# Patient Record
Sex: Male | Born: 1975 | Race: Black or African American | Hispanic: No | Marital: Married | Smoking: Current every day smoker
Health system: Southern US, Community
[De-identification: ages and names within clinical notes are randomized; demographics above are authoritative.]

## PROBLEM LIST (undated history)

## (undated) DIAGNOSIS — R51 Headache: Secondary | ICD-10-CM

## (undated) DIAGNOSIS — K219 Gastro-esophageal reflux disease without esophagitis: Secondary | ICD-10-CM

## (undated) HISTORY — DX: Gastro-esophageal reflux disease without esophagitis: K21.9

## (undated) HISTORY — DX: Headache: R51

## (undated) HISTORY — PX: APPENDECTOMY: SHX54

---

## 2012-02-02 ENCOUNTER — Emergency Department (HOSPITAL_BASED_OUTPATIENT_CLINIC_OR_DEPARTMENT_OTHER)
Admission: EM | Admit: 2012-02-02 | Discharge: 2012-02-02 | Disposition: A | Discharge status: Home / Self Care | Payer: Self-pay | Attending: Emergency Medicine | Admitting: Emergency Medicine

## 2012-02-02 ENCOUNTER — Encounter (HOSPITAL_BASED_OUTPATIENT_CLINIC_OR_DEPARTMENT_OTHER): Payer: Self-pay | Admitting: Emergency Medicine

## 2012-02-02 ENCOUNTER — Emergency Department (INDEPENDENT_AMBULATORY_CARE_PROVIDER_SITE_OTHER): Payer: Self-pay

## 2012-02-02 DIAGNOSIS — M7918 Myalgia, other site: Secondary | ICD-10-CM

## 2012-02-02 DIAGNOSIS — J45909 Unspecified asthma, uncomplicated: Secondary | ICD-10-CM | POA: Insufficient documentation

## 2012-02-02 DIAGNOSIS — IMO0001 Reserved for inherently not codable concepts without codable children: Secondary | ICD-10-CM | POA: Insufficient documentation

## 2012-02-02 DIAGNOSIS — R079 Chest pain, unspecified: Secondary | ICD-10-CM

## 2012-02-02 DIAGNOSIS — R109 Unspecified abdominal pain: Secondary | ICD-10-CM | POA: Insufficient documentation

## 2012-02-02 LAB — URINALYSIS, ROUTINE W REFLEX MICROSCOPIC
Bilirubin Urine: NEGATIVE
Nitrite: NEGATIVE
Specific Gravity, Urine: 1.023 (ref 1.005–1.030)
Urobilinogen, UA: 1 mg/dL (ref 0.0–1.0)

## 2012-02-02 MED ORDER — OXYCODONE-ACETAMINOPHEN 5-325 MG PO TABS: 1.0000 | ORAL_TABLET | Freq: Four times a day (QID) | ORAL | Status: AC | PRN

## 2012-02-02 MED ORDER — IBUPROFEN 800 MG PO TABS: 800.0000 mg | ORAL_TABLET | Freq: Three times a day (TID) | ORAL | Status: AC

## 2012-02-02 MED ORDER — KETOROLAC TROMETHAMINE 30 MG/ML IJ SOLN
30.0000 mg | Freq: Once | INTRAMUSCULAR | Status: AC
  Administered 2012-02-02: 30 mg via INTRAMUSCULAR
  Filled 2012-02-02: qty 1

## 2012-02-02 MED ORDER — CYCLOBENZAPRINE HCL 10 MG PO TABS: 10.0000 mg | ORAL_TABLET | Freq: Every evening | ORAL | Status: AC | PRN

## 2012-02-02 NOTE — ED Notes (Signed)
Pt with pain in right rib area mid axillary. Onset of pain after push mowing lawn on sat and progressively worsening.

## 2012-02-02 NOTE — Discharge Instructions (Signed)
Pain Medicine Instructions You have been given a prescription for pain medicines. These medicines may affect your ability to think clearly. They may also affect your ability to perform physical activities. Take these medicines only as needed for pain. You do not need to take them if you are not having pain, unless directed by your caregiver. You can take less than the prescribed dose if you find a smaller amount of medicine controls the pain. It may not be possible to make all of your pain go away, but you should be comfortable enough to move, breathe, and take care of yourself. After you start taking pain medicines, while taking the medicines, and for 8 hours after stopping the medicines:  Do not drive.   Do not operate machinery.   Do not operate power tools.   Do not sign legal documents.   Do not supervise children by yourself.   Do not participate in activities that require climbing or being in high places.   Do not enter a body of water (lake, river, ocean, spa, swimming pool) without an adult nearby who can help you.  You may have been prescribed a pain medicine that contains acetaminophen (paracetamol). If so, take only the amount directed by your caregiver. Do not take any other acetaminophen while taking this medicine. An overdose of acetaminophen can result in severe liver damage. If you are taking other medicines, check the active ingredients for acetaminophen. Acetaminophen is found in hundreds of over-the-counter and prescription medicines. These include cold relief products, menstrual cramp relief medicines, fever-reducing medicines, acid indigestion relief products, and pain relief products. HOME CARE INSTRUCTIONS   Do not drink alcohol, take sleeping pills, or take other medicines until at least 8 hours after your last dose of pain medicine, or as directed by your caregiver.   Use a bulk stool softener if you become constipated from your pain medicines. Increasing your intake  of fruits and vegetables will also help.   Write down the times when you take your medicines. Look at the times before taking your next dose of medicine. It is easy to become confused while on pain medicines. Recording the times helps you to avoid an overdose.  SEEK MEDICAL CARE IF:  Your medicine is not helping the pain go away.   You vomit or have diarrhea shortly after taking the medicine.   You develop new pain in areas that did not hurt before.  SEEK IMMEDIATE MEDICAL CARE IF:  You feel dizzy or faint.   You feel there are other problems that might be caused by your medicine.  MAKE SURE YOU:   Understand these instructions.   Will watch your condition.   Will get help right away if you are not doing well or get worse.  Document Released: 01/04/2001 Document Revised: 09/17/2011 Document Reviewed: 09/12/2010 ExitCare Patient Information 2012 [employer]  RESOURCE GUIDE  Dental Problems  Patients with Medicaid: [facility]                     [facility] [address]                                           [address] Phone:  [phone]                                                     Phone:  [phone]  If unable to pay or uninsured, contact:  [facility] or [facility] to become qualified for the adult dental clinic.  Chronic Pain Problems Contact [facility]  [phone] Patients need to be referred by their primary care doctor.  Insufficient Money for Medicine Contact [employer]:  call "211" or [facility] 271-5999.  No Primary Care Doctor Call [facility]  832-8000 Other agencies that provide inexpensive medical care    [facility]  [phone]     Internal Medicine  [phone]    [facility]  [phone]    Women's Clinic  [phone]    [facility]  [phone]    [facility]  [phone]  [facility]  [phone] [facility]  [phone] [facility]   [phone] (emergency services [phone])  Abuse/Neglect [place] Child Abuse Hotline (336) 641-3795 [place] [facility] Hotline 800-378-5315 (After Hours)  Emergency Shelter [facility] (336) 271-5985  [facility] (336) 275-9566 [facility] (704) 372-4663  MRSA Hotline #:   832-7006    [place] [facility]  [facility] [place]  [employer]                           [facility] [address] [place]                     [address]         [address]  [place]                                               [place]                              [place] Phone:  [phone]                                  Phone:  [phone]                   Phone:  [phone]  [facility] Phone:  [phone]  [place] Child Abuse Hotline (336) 342-1394 (336) 342-3537 (After Hours)  

## 2012-02-02 NOTE — ED Provider Notes (Addendum)
History     CSN: [id number]  Arrival date & time 02/02/12  0038   First MD Initiated Contact with Patient 02/02/12 0049      Chief Complaint  Patient presents with  . Flank Pain    (Consider location/radiation/quality/duration/timing/severity/associated sxs/prior treatment) HPI  pw 3 days of Rt flank pain. Constant. Worse with movement and lying down. Worse with deep breathing. No relief with tylenol Pain 8/10 at this time. Denies shortness of breath, chest pain. Min dry cough. Pain began after cutting grass but denies specific injury. Lifts occ at work but denies specific injury. Denies N/V/fever/chills. Denies constipation, diarrhea. Denies hematuria/dysuria/freq/urgency. Denies rash, sick contacts.  Denies history of recent trauma/falls. Denies h/o malignancy, DM, immunocompromise  injection drug use, immunosuppression, indwelling urinary catheter, prolonged steroid use, skin or urinary tract infection. No numbness/tingling/weakness of extremities.  Denies saddle anesthesia, no urinary incontinence or retention. Denies h/o VTE in self or family. No recent hosp/surg/immob. No h/o cancer. Denies exogenous hormone use, no leg pain or swelling.  No h/o nephrolithiasis. H/o chronic lower back pain. This feels different   ED Notes, ED Provider Notes from 02/02/12 0000 to 02/02/12 00:46:05       Kellie Dulcy Fanny, RN 02/02/2012 00:44      Pt with pain in right rib area mid axillary. Onset of pain after push mowing lawn on sat and progressively worsening.     Past Medical History  Diagnosis Date  . Asthma     Past Surgical History  Procedure Date  . Appendectomy     No family history on file.  History  Substance Use Topics  . Smoking status: Current Everyday Smoker  . Smokeless tobacco: Not on file  . Alcohol Use: Yes      Review of Systems  All other systems reviewed and are negative.   except as noted HPI   Allergies  Review of patient's allergies indicates no  known allergies.  Home Medications   Current Outpatient Rx  Name Route Sig Dispense Refill  . ACETAMINOPHEN 650 MG RE SUPP Oral Take 650 mg by mouth as needed.    . CYCLOBENZAPRINE HCL 10 MG PO TABS Oral Take 1 tablet (10 mg total) by mouth at bedtime as needed for muscle spasms. 10 tablet 0  . IBUPROFEN 800 MG PO TABS Oral Take 1 tablet (800 mg total) by mouth 3 (three) times daily. 21 tablet 0  . OXYCODONE-ACETAMINOPHEN 5-325 MG PO TABS Oral Take 1 tablet by mouth every 6 (six) hours as needed for pain. 10 tablet 0    BP 148/88  Pulse 90  Temp(Src) 98.2 F (36.8 C) (Oral)  Resp 18  Ht 5\' 11"  (1.803 m)  Wt 200 lb (90.719 kg)  BMI 27.89 kg/m2  SpO2 99%  Physical Exam  Nursing note and vitals reviewed. Constitutional: He is oriented to person, place, and time. He appears well-developed and well-nourished. No distress.  HENT:  Head: Atraumatic.  Mouth/Throat: Oropharynx is clear and moist.  Eyes: Conjunctivae are normal. Pupils are equal, round, and reactive to light.  Neck: Neck supple.  Cardiovascular: Normal rate, regular rhythm, normal heart sounds and intact distal pulses.  Exam reveals no gallop and no friction rub.   No murmur heard. Pulmonary/Chest: Effort normal. No respiratory distress. He has no wheezes. He has no rales.  Abdominal: Soft. Bowel sounds are normal. There is no tenderness. There is no rebound and no guarding.  Musculoskeletal: Normal range of motion. He exhibits no edema and no  tenderness.  Neurological: He is alert and oriented to person, place, and time.  Skin: Skin is warm and dry.          ttp  Psychiatric: He has a normal mood and affect.    ED Course  Procedures (including critical care time)  Labs Reviewed  URINALYSIS, ROUTINE W REFLEX MICROSCOPIC - Abnormal; Notable for the following:    APPearance CLOUDY (*)    All other components within normal limits   Dg Ribs Unilateral W/chest Right  02/02/2012  *RADIOLOGY REPORT*  Clinical Data:  Anterior and right lower rib pain for 2 days.  No injury.  RIGHT RIBS AND CHEST - 3+ VIEW  Comparison: None.  Findings: Normal heart size and pulmonary vascularity.  No focal airspace consolidation in the lungs.  No blunting of costophrenic angles.  No pneumothorax.  Right ribs appear intact.  No evidence of acute fracture.  No focal bone lesion or bone expansion.  IMPRESSION: No evidence of active pulmonary disease.  No displaced right rib fractures.  Original Report Authenticated By: Marlon Pel, M.D.    1. Musculoskeletal pain     MDM  Musculoskeletal pain without clear precipitating factors. Doubt PE (PERC neg, unlikely by history). No abdominal ttp. Doubt problem with liver. Low susp nephrolithiasis based on clinical exam and history. Rib/CXR without bony lesion, no pna (clinically, low susp). Pain 8-->6/10 with toradol. Driving. Home with flexeril, percocet, ibuprofen. No EMC precluding discharge at this time. Given Precautions for return. PMD f/u.         Forbes Cellar, MD 02/02/12 [id number]  Forbes Cellar, MD 02/02/12 0230

## 2012-02-07 ENCOUNTER — Emergency Department (HOSPITAL_BASED_OUTPATIENT_CLINIC_OR_DEPARTMENT_OTHER)
Admission: EM | Admit: 2012-02-07 | Discharge: 2012-02-07 | Disposition: A | Discharge status: Home / Self Care | Payer: Self-pay | Attending: Emergency Medicine | Admitting: Emergency Medicine

## 2012-02-07 ENCOUNTER — Encounter (HOSPITAL_BASED_OUTPATIENT_CLINIC_OR_DEPARTMENT_OTHER): Payer: Self-pay | Admitting: *Deleted

## 2012-02-07 DIAGNOSIS — F17200 Nicotine dependence, unspecified, uncomplicated: Secondary | ICD-10-CM | POA: Insufficient documentation

## 2012-02-07 DIAGNOSIS — J45909 Unspecified asthma, uncomplicated: Secondary | ICD-10-CM | POA: Insufficient documentation

## 2012-02-07 DIAGNOSIS — B029 Zoster without complications: Secondary | ICD-10-CM | POA: Insufficient documentation

## 2012-02-07 MED ORDER — OXYCODONE-ACETAMINOPHEN 5-325 MG PO TABS: 1.0000 | ORAL_TABLET | Freq: Four times a day (QID) | ORAL | Status: AC | PRN

## 2012-02-07 MED ORDER — VALACYCLOVIR HCL 1 G PO TABS: 1000.0000 mg | ORAL_TABLET | Freq: Three times a day (TID) | ORAL | Status: AC

## 2012-02-07 NOTE — ED Provider Notes (Signed)
History     CSN: [id number]  Arrival date & time 02/07/12  0130   First MD Initiated Contact with Patient 02/07/12 0142      Chief Complaint  Patient presents with  . Herpes Zoster    (Consider location/radiation/quality/duration/timing/severity/associated sxs/prior treatment) Patient is a 36 y.o. male presenting with rash.  Rash  This is a new problem. The current episode started 2 days ago. The problem has not changed since onset.The problem is associated with nothing. There has been no fever. The rash is present on the back and torso. The pain is at a severity of 10/10. The pain is severe. The pain has been constant since onset. Associated symptoms include blisters and pain. He has tried nothing for the symptoms. The treatment provided no relief. Risk factors: None.  Seen for back and axillary pain several days ago.  Now with burning vesicular eruption on R back along a dermatome to the abdomen.    Past Medical History  Diagnosis Date  . Asthma     Past Surgical History  Procedure Date  . Appendectomy     History reviewed. No pertinent family history.  History  Substance Use Topics  . Smoking status: Current Everyday Smoker  . Smokeless tobacco: Not on file  . Alcohol Use: Yes      Review of Systems  Skin: Positive for rash.  All other systems reviewed and are negative.    Allergies  Review of patient's allergies indicates no known allergies.  Home Medications   Current Outpatient Rx  Name Route Sig Dispense Refill  . ACETAMINOPHEN 650 MG RE SUPP Oral Take 650 mg by mouth as needed.    . CYCLOBENZAPRINE HCL 10 MG PO TABS Oral Take 1 tablet (10 mg total) by mouth at bedtime as needed for muscle spasms. 10 tablet 0  . IBUPROFEN 800 MG PO TABS Oral Take 1 tablet (800 mg total) by mouth 3 (three) times daily. 21 tablet 0  . OXYCODONE-ACETAMINOPHEN 5-325 MG PO TABS Oral Take 1 tablet by mouth every 6 (six) hours as needed for pain. 10 tablet 0  .  OXYCODONE-ACETAMINOPHEN 5-325 MG PO TABS Oral Take 1 tablet by mouth every 6 (six) hours as needed for pain. 13 tablet 0  . VALACYCLOVIR HCL 1 G PO TABS Oral Take 1 tablet (1,000 mg total) by mouth 3 (three) times daily. 21 tablet 0    BP 128/57  Pulse 88  Temp(Src) 97.9 F (36.6 C) (Oral)  Resp 18  SpO2 97%  Physical Exam  Constitutional: He is oriented to person, place, and time. He appears well-developed and well-nourished. No distress.  HENT:  Mouth/Throat: Oropharynx is clear and moist. No oropharyngeal exudate.       No facial lesions nor oral lesions  Eyes: Conjunctivae are normal. Pupils are equal, round, and reactive to light.  Neck: Normal range of motion. Neck supple.  Cardiovascular: Normal rate and regular rhythm.   Pulmonary/Chest: Effort normal and breath sounds normal.  Abdominal: Soft. Bowel sounds are normal. There is no rebound and no guarding.  Musculoskeletal: Normal range of motion. He exhibits no edema.  Neurological: He is alert and oriented to person, place, and time.  Skin: Skin is warm and dry. Rash noted.     Psychiatric: He has a normal mood and affect.    ED Course  Procedures (including critical care time)  Labs Reviewed - No data to display No results found.   1. Herpes zoster  MDM  Follow up for recheck with your family doctor in 5 days take all medications.  Return for fevers stiff neck altered sensorium or any concerning symptoms        Juan Cielo K Marche Hottenstein-Rasch, MD 02/07/12 4098

## 2012-02-07 NOTE — Discharge Instructions (Signed)

## 2012-02-07 NOTE — ED Notes (Signed)
Pt with rash to abd and back painful and blistered

## 2016-11-05 ENCOUNTER — Emergency Department (HOSPITAL_BASED_OUTPATIENT_CLINIC_OR_DEPARTMENT_OTHER)
Admission: EM | Admit: 2016-11-05 | Discharge: 2016-11-06 | Disposition: A | Discharge status: Home / Self Care | Payer: No Typology Code available for payment source | Attending: Emergency Medicine | Admitting: Emergency Medicine

## 2016-11-05 ENCOUNTER — Encounter (HOSPITAL_BASED_OUTPATIENT_CLINIC_OR_DEPARTMENT_OTHER): Payer: Self-pay | Admitting: *Deleted

## 2016-11-05 DIAGNOSIS — J019 Acute sinusitis, unspecified: Secondary | ICD-10-CM | POA: Insufficient documentation

## 2016-11-05 DIAGNOSIS — Y939 Activity, unspecified: Secondary | ICD-10-CM | POA: Diagnosis not present

## 2016-11-05 DIAGNOSIS — S3992XA Unspecified injury of lower back, initial encounter: Secondary | ICD-10-CM | POA: Diagnosis present

## 2016-11-05 DIAGNOSIS — Y999 Unspecified external cause status: Secondary | ICD-10-CM | POA: Diagnosis not present

## 2016-11-05 DIAGNOSIS — S300XXA Contusion of lower back and pelvis, initial encounter: Secondary | ICD-10-CM

## 2016-11-05 DIAGNOSIS — W182XXA Fall in (into) shower or empty bathtub, initial encounter: Secondary | ICD-10-CM | POA: Insufficient documentation

## 2016-11-05 DIAGNOSIS — J45909 Unspecified asthma, uncomplicated: Secondary | ICD-10-CM | POA: Insufficient documentation

## 2016-11-05 DIAGNOSIS — F17200 Nicotine dependence, unspecified, uncomplicated: Secondary | ICD-10-CM | POA: Diagnosis not present

## 2016-11-05 DIAGNOSIS — Y929 Unspecified place or not applicable: Secondary | ICD-10-CM | POA: Insufficient documentation

## 2016-11-05 MED ORDER — IBUPROFEN 800 MG PO TABS
800.0000 mg | ORAL_TABLET | Freq: Once | ORAL | Status: AC
  Administered 2016-11-05: 800 mg via ORAL
  Filled 2016-11-05: qty 1

## 2016-11-05 NOTE — ED Triage Notes (Signed)
Cough and cold symptoms. States he wants to find out if he has the flu. He also fell in the tub last night and hurt his back. Pain in his lower back.

## 2016-11-06 ENCOUNTER — Emergency Department (HOSPITAL_BASED_OUTPATIENT_CLINIC_OR_DEPARTMENT_OTHER): Payer: No Typology Code available for payment source

## 2016-11-06 MED ORDER — AMOXICILLIN 500 MG PO CAPS: 500.0000 mg | ORAL_CAPSULE | Freq: Three times a day (TID) | ORAL | 0 refills | Status: DC

## 2016-11-06 MED ORDER — METHOCARBAMOL 500 MG PO TABS: 500.0000 mg | ORAL_TABLET | Freq: Two times a day (BID) | ORAL | 0 refills | Status: DC

## 2016-11-06 MED ORDER — HYDROCODONE-ACETAMINOPHEN 5-325 MG PO TABS: 2.0000 | ORAL_TABLET | ORAL | 0 refills | Status: DC | PRN

## 2016-11-06 NOTE — Discharge Instructions (Signed)
See your Physician for recheck.  Return if any problems.  °

## 2016-11-06 NOTE — ED Provider Notes (Signed)
[facility]   CSN: [id number] Arrival date & time: 11/05/16  2221     History   Chief Complaint Chief Complaint  Patient presents with  . Fall  . Cough    HPI Juan Howard is a 41 y.o. male.  The history is provided by the patient. No language interpreter was used.  Fall  This is a new problem. The problem occurs constantly. Pertinent negatives include no headaches. Nothing aggravates the symptoms. Nothing relieves the symptoms. He has tried nothing for the symptoms. The treatment provided no relief.  Cough  Pertinent negatives include no headaches.  Pt complains of congestion and cough for the past 2 weeks. Pt reports he fell and hit low back.  Pt reports he has had some previous low back pain   Past Medical History:  Diagnosis Date  . Asthma     There are no active problems to display for this patient.   Past Surgical History:  Procedure Laterality Date  . APPENDECTOMY         Home Medications    Prior to Admission medications   Medication Sig Start Date End Date Taking? Authorizing Provider  acetaminophen (TYLENOL) 650 MG suppository Take 650 mg by mouth as needed.    Historical Provider, MD  amoxicillin (AMOXIL) 500 MG capsule Take 1 capsule (500 mg total) by mouth 3 (three) times daily. 11/06/16   Elson Areas, PA-C  HYDROcodone-acetaminophen (NORCO/VICODIN) 5-325 MG tablet Take 2 tablets by mouth every 4 (four) hours as needed. 11/06/16   Elson Areas, PA-C  methocarbamol (ROBAXIN) 500 MG tablet Take 1 tablet (500 mg total) by mouth 2 (two) times daily. 11/06/16   Elson Areas, PA-C    Family History No family history on file.  Social History Social History  Substance Use Topics  . Smoking status: Current Every Day Smoker  . Smokeless tobacco: Never Used  . Alcohol use Yes     Allergies   Patient has no known allergies.   Review of Systems Review of Systems  Respiratory: Positive for cough.   Neurological:  Negative for headaches.  All other systems reviewed and are negative.    Physical Exam Updated Vital Signs BP 140/85   Pulse 84   Temp 98 F (36.7 C) (Oral)   Resp 20   Ht 5\' 11"  (1.803 m)   Wt 90.7 kg   SpO2 99%   BMI 27.89 kg/m   Physical Exam  Constitutional: He appears well-developed and well-nourished.  HENT:  Head: Normocephalic and atraumatic.  Right Ear: External ear normal.  Left Ear: External ear normal.  Eyes: Conjunctivae are normal.  Neck: Neck supple.  Cardiovascular: Normal rate and regular rhythm.   No murmur heard. Pulmonary/Chest: Effort normal and breath sounds normal. No respiratory distress.  Abdominal: Soft. There is no tenderness.  Musculoskeletal: Normal range of motion. He exhibits no edema.  Diffusely tender low spine   Neurological: He is alert.  Skin: Skin is warm and dry.  Psychiatric: He has a normal mood and affect.  Nursing note and vitals reviewed.    ED Treatments / Results  Labs (all labs ordered are listed, but only abnormal results are displayed) Labs Reviewed - No data to display  EKG  EKG Interpretation None       Radiology Dg Lumbar Spine Complete  Result Date: 11/06/2016 CLINICAL DATA:  Chronic lower back pain. Fell in bathtub. Left leg pain. Initial encounter. EXAM: LUMBAR SPINE - COMPLETE  4+ VIEW COMPARISON:  None. FINDINGS: There is no evidence of fracture or subluxation. Vertebral bodies demonstrate normal height and alignment. Intervertebral disc spaces are preserved. The visualized neural foramina are grossly unremarkable in appearance. The visualized bowel gas pattern is unremarkable in appearance; air and stool are noted within the colon. The sacroiliac joints are within normal limits. IMPRESSION: No evidence of fracture or subluxation along the lumbar spine. Electronically Signed   By: Roanna RaiderJeffery  Chang M.D.   On: 11/06/2016 00:58    Procedures Procedures (including critical care time)  Medications Ordered in  ED Medications  ibuprofen (ADVIL,MOTRIN) tablet 800 mg (800 mg Oral Given 11/05/16 2236)     Initial Impression / Assessment and Plan / ED Course  I have reviewed the triage vital signs and the nursing notes.  Pertinent labs & imaging results that were available during my care of the patient were reviewed by me and considered in my medical decision making (see chart for details).       Final Clinical Impressions(s) / ED Diagnoses   Final diagnoses:  Acute sinusitis, recurrence not specified, unspecified location  Contusion of lower back, initial encounter    New Prescriptions New Prescriptions   AMOXICILLIN (AMOXIL) 500 MG CAPSULE    Take 1 capsule (500 mg total) by mouth 3 (three) times daily.   HYDROCODONE-ACETAMINOPHEN (NORCO/VICODIN) 5-325 MG TABLET    Take 2 tablets by mouth every 4 (four) hours as needed.   METHOCARBAMOL (ROBAXIN) 500 MG TABLET    Take 1 tablet (500 mg total) by mouth 2 (two) times daily.     Lonia SkinnerLeslie K New BostonSofia, PA-C 11/06/16 0122    Paula LibraJohn Molpus, MD 11/06/16 [id number]    Paula LibraJohn Molpus, MD 11/06/16 [phone]

## 2017-06-30 ENCOUNTER — Encounter: Payer: Self-pay | Admitting: Medical

## 2017-06-30 ENCOUNTER — Ambulatory Visit (INDEPENDENT_AMBULATORY_CARE_PROVIDER_SITE_OTHER): Payer: No Typology Code available for payment source | Admitting: Medical

## 2017-06-30 ENCOUNTER — Ambulatory Visit (HOSPITAL_BASED_OUTPATIENT_CLINIC_OR_DEPARTMENT_OTHER)
Admission: RE | Admit: 2017-06-30 | Discharge: 2017-06-30 | Disposition: A | Discharge status: Home / Self Care | Payer: No Typology Code available for payment source | Source: Ambulatory Visit | Attending: Medical | Admitting: Medical

## 2017-06-30 ENCOUNTER — Telehealth: Payer: Self-pay | Admitting: Medical

## 2017-06-30 VITALS — BP 147/72 | HR 74 | Temp 98.0°F | Resp 16 | Ht 71.0 in | Wt 190.8 lb

## 2017-06-30 DIAGNOSIS — R51 Headache: Secondary | ICD-10-CM

## 2017-06-30 DIAGNOSIS — F439 Reaction to severe stress, unspecified: Secondary | ICD-10-CM

## 2017-06-30 DIAGNOSIS — F419 Anxiety disorder, unspecified: Secondary | ICD-10-CM | POA: Diagnosis not present

## 2017-06-30 DIAGNOSIS — F329 Major depressive disorder, single episode, unspecified: Secondary | ICD-10-CM | POA: Diagnosis not present

## 2017-06-30 DIAGNOSIS — R519 Headache, unspecified: Secondary | ICD-10-CM

## 2017-06-30 DIAGNOSIS — G4452 New daily persistent headache (NDPH): Secondary | ICD-10-CM

## 2017-06-30 DIAGNOSIS — R030 Elevated blood-pressure reading, without diagnosis of hypertension: Secondary | ICD-10-CM | POA: Diagnosis not present

## 2017-06-30 DIAGNOSIS — F32A Depression, unspecified: Secondary | ICD-10-CM

## 2017-06-30 MED ORDER — SERTRALINE HCL 50 MG PO TABS: 50.0000 mg | ORAL_TABLET | Freq: Every day | ORAL | 0 refills | Status: DC

## 2017-06-30 MED ORDER — BUTALBITAL-ASA-CAFF-CODEINE 50-325-40-30 MG PO CAPS: 1.0000 | ORAL_CAPSULE | Freq: Four times a day (QID) | ORAL | 0 refills | Status: DC | PRN

## 2017-06-30 MED ORDER — CLONAZEPAM 0.5 MG PO TABS: 0.5000 mg | ORAL_TABLET | Freq: Two times a day (BID) | ORAL | 0 refills | Status: DC | PRN

## 2017-06-30 NOTE — Telephone Encounter (Signed)
Open not chart to review before call patient.

## 2017-06-30 NOTE — Patient Instructions (Addendum)
HA may be associated with high blood pressures. Your bp today when I checked was 135/88 today then 140/90. Please have you wife check bp daily(twice a day) and give [place] update on bp reading in one week.(no nsaids while trying to determine bp levels)  For decreased mood and anxiety, rx sertraline. If you get extreme anxiety making few low dose clonazepam.   For low level ha now can use tylenol. Later in the day if you have severe ha then use butalbital. Don't use clonazepam today since we anticipate you will use butalbital later today.(use first tab at home due to possible side effects).   CT of head order placed. Will order this. Consider if negative and no response with above then consider mri or refer to neurologist.  If any severe ha with neurologic or motor deficits then ED evaluation.  Follow up in 7-10 days or as needed

## 2017-06-30 NOTE — Progress Notes (Signed)
Subjective:    Patient ID: Juan Howard, male    DOB: 10-16-1975, 41 y.o.   MRN: [id number]  HPI   Pt in for first time.  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 Alcohol moderate amount(12 pack a week)  Pt states over past month and half had mild HA. But over past 2 weeks HA is getting worse.  1st 3-4 weeks ha were low level and transient. But constant ha over pas 2t weeks. Pt states wife was talking loudly to daughter and when he woke up he immediately noted severe ha. Gaylyn Rong recently can be so severe that will make him cry. Pt does describe light and sound sensitivity. In past very rare ha. Pt has upcoming yearly check up for optometrist in December. No blurred vision today. But one of his ha was intense in past and vision was mild blurred. When wife checks his bp most of time bp is good. Currently his ha is only 1/10.  Pt has some financial stress due to decreased work. He is a Curator. He is angry and short tempered recently. His wife states he stresses too much. Very tense at work.   Pt when he watches football games drinks beers. Moderate amount when he watches football.   Review of Systems  Constitutional: Negative for chills, fatigue and fever.  HENT: Negative for congestion, ear discharge, ear pain, nosebleeds, postnasal drip and rhinorrhea.   Eyes: Positive for photophobia and visual disturbance. Negative for pain, discharge and itching.       Only one headache to the have some blurred vision. Not presently and that was about a week ago. Other headaches no blurred vision reported.  Respiratory: Negative for cough, chest tightness, shortness of breath and wheezing.   Cardiovascular: Negative for chest pain and palpitations.  Gastrointestinal: Negative for abdominal pain, anal bleeding, constipation, diarrhea and nausea.  Genitourinary: Negative for dysuria and flank pain.  Musculoskeletal: Negative for back  pain, gait problem, joint swelling and neck pain.  Skin: Negative for rash.  Neurological: Positive for headaches. Negative for dizziness, syncope, speech difficulty, weakness and numbness.       States at times ha wakes him form sleep.  Hematological: Negative for adenopathy. Does not bruise/bleed easily.  Psychiatric/Behavioral: Positive for dysphoric mood. Negative for behavioral problems, decreased concentration, hallucinations, sleep disturbance and suicidal ideas. The patient is nervous/anxious.     Past Medical History:  Diagnosis Date  . Asthma      Social History   Social History  . Marital status: Married    Spouse name: N/A  . Number of children: N/A  . Years of education: N/A   Occupational History  . Not on file.   Social History Main Topics  . Smoking status: Current Every Day Smoker  . Smokeless tobacco: Never Used  . Alcohol use Yes  . Drug use: Yes    Types: Marijuana  . Sexual activity: Not on file   Other Topics Concern  . Not on file   Social History Narrative  . No narrative on file    Past Surgical History:  Procedure Laterality Date  . APPENDECTOMY      History reviewed. No pertinent family history.  No Known Allergies  No current outpatient prescriptions on file prior to visit.   No current facility-administered medications on file prior to visit.     BP (!) 147/72   Pulse 74  Temp 98 F (36.7 C) (Oral)   Resp 16   Ht  (1.803 m)   Wt 190 lb 12.8 oz (86.5 kg)   SpO2 100%   BMI 26.61 kg/m       Objective:   Physical Exam  General Mental Status- Alert. General Appearance- Not in acute distress.   Skin General: Color- Normal Color. Moisture- Normal Moisture.  Neck Carotid Arteries- Normal color. Moisture- Normal Moisture. No carotid bruits. No JVD. Some mild right trapezius tenderness to palpation.  Chest and Lung Exam Auscultation: Breath Sounds:-Normal.  Cardiovascular Auscultation:Rythm- Regular. Murmurs  & Other Heart Sounds:Auscultation of the heart reveals- No Murmurs.  Abdomen Inspection:-Inspeection Normal. Palpation/Percussion:Note:No mass. Palpation and Percussion of the abdomen reveal- Non Tender, Non Distended + BS, no rebound or guarding.   Neurologic Cranial Nerve exam:- CN III-XII intact(No nystagmus), symmetric smile. Drift Test:- No drift. Finger to Nose:- Normal/Intact Strength:- 5/5 equal and symmetric strength both upper and lower extremities.     Assessment & Plan:  HA may be associated with high blood pressures. Your bp today when I checked was 135/88  today then 140/90. Please have you wife check bp daily(twice a day) and give [place] update on bp reading in one week.  For decreased mood and anxiety, rx sertraline. If you get extreme anxiety making few low dose clonazepam.   For low level ha now can use tylenol. Later in the day if you have severe ha then use butalbital. Don't use clonazepam today since we anticipate you will use butalbital later today.(use first tab at home due to possible side effects).   CT of head order placed. Will order this. Consider if negative and no response with above then consider mri or refer to neurologist.  If any severe ha with neurologic or motor deficits then ED evaluation.  Follow up in 7-10 days or as needed  Amelio Brosky, Ramon Dredge, [employer]

## 2017-07-06 ENCOUNTER — Telehealth: Payer: Self-pay | Admitting: Medical

## 2017-07-06 NOTE — Telephone Encounter (Signed)
Caller name: Lavel Relation to pt: self  Call back number: [phone] Pharmacy:  Reason for call: Pt is wanting to know if he can get a refill for clonazePAM (KLONOPIN) 0.5 MG tablet, pt states was only given 6 pills and that the med was really helping him to sleep, (last night 07-05-2017, pt was not able to sleep since he was out of meds and the headaches was waking him up). Pt states has an appt on Thursday 07-08-2017 if possible to get the meds before his appt. Please advise.

## 2017-07-07 NOTE — Telephone Encounter (Signed)
I saw patient request for clonazepam after hours. He has appointment for tomorrow. So decided not to prescribe him medication tonight. Need to discuss with him different options for anxiety. If he continues to use clonazepam on a regular basis then want him to sign contract and give UDS. We'll let him know tomorrow.  If he does not keep his appointment tomorrow please call him see how he is doing and we can give him a limited number until he comes in.

## 2017-07-08 ENCOUNTER — Ambulatory Visit (INDEPENDENT_AMBULATORY_CARE_PROVIDER_SITE_OTHER): Payer: No Typology Code available for payment source | Admitting: Medical

## 2017-07-08 ENCOUNTER — Telehealth: Payer: Self-pay | Admitting: Medical

## 2017-07-08 ENCOUNTER — Encounter: Payer: Self-pay | Admitting: Medical

## 2017-07-08 VITALS — BP 130/70 | HR 101 | Temp 98.1°F | Resp 16 | Ht 71.0 in | Wt 186.8 lb

## 2017-07-08 DIAGNOSIS — G4459 Other complicated headache syndrome: Secondary | ICD-10-CM

## 2017-07-08 DIAGNOSIS — R519 Headache, unspecified: Secondary | ICD-10-CM

## 2017-07-08 DIAGNOSIS — G4452 New daily persistent headache (NDPH): Secondary | ICD-10-CM | POA: Diagnosis not present

## 2017-07-08 DIAGNOSIS — F439 Reaction to severe stress, unspecified: Secondary | ICD-10-CM

## 2017-07-08 DIAGNOSIS — Z79899 Other long term (current) drug therapy: Secondary | ICD-10-CM | POA: Diagnosis not present

## 2017-07-08 DIAGNOSIS — R51 Headache: Secondary | ICD-10-CM | POA: Diagnosis not present

## 2017-07-08 DIAGNOSIS — F419 Anxiety disorder, unspecified: Secondary | ICD-10-CM

## 2017-07-08 MED ORDER — CLONAZEPAM 0.5 MG PO TABS: 0.5000 mg | ORAL_TABLET | Freq: Every day | ORAL | 1 refills | Status: AC

## 2017-07-08 MED ORDER — DICLOFENAC SODIUM 75 MG PO TBEC: DELAYED_RELEASE_TABLET | ORAL | 0 refills | Status: DC

## 2017-07-08 MED ORDER — CYCLOBENZAPRINE HCL 10 MG PO TABS: ORAL_TABLET | ORAL | 0 refills | Status: DC

## 2017-07-08 MED ORDER — SERTRALINE HCL 100 MG PO TABS: 100.0000 mg | ORAL_TABLET | Freq: Every day | ORAL | 3 refills | Status: DC

## 2017-07-08 NOTE — Telephone Encounter (Signed)
Would you look at patient's neurology referral and MRI order. [facility] is [facility] available after 2 PM? Please let me know tomorrow 07/09/2017

## 2017-07-08 NOTE — Patient Instructions (Addendum)
For your recent severe headaches, I have put in referral for you to see a neurologist. I am trying to get this done as quickly as possible. I have asked referral staff to call around to  see who  can see you the fastest. I have decided to go ahead and try to get MRI of the head scheduled.  For your persisting anxiety and stress, I am increasing your sertraline 100 mg a day and refilling your prescription of clonazepam. I do want you to sign control medication contract and give UDS today.  Tonight I want you to try prescription of diclofenac and Flexeril muscle relaxant for the headache.(I would ask that you hold the clonazepam tonight when you use the Flexeril.) I want to know if this combination stops your headache. Please give me update. Pending referral to neurologist, I still advise you to be seen in the emergency department for severe headache or motor/neuro symptoms as we discussed. If you need to be seen in the emergency department recommend [facility] Main ED as they do have MRI capability.  Also would ask that you wife continue to check your blood pressure and make sure your blood pressure is not hovering around 140/90(or above that level.)  Follow-up in 7-10 days or as needed.

## 2017-07-08 NOTE — Progress Notes (Signed)
Subjective:    Patient ID: Juan Howard, male    DOB: 1976/01/23, 41 y.o.   MRN: [id number]  HPI   Pt in for follow up.  Pt states he is still stress about work. But he states it is not extreme stress. But still financial concerns. Pt did use klonopin daily about 4 pm in afternoon and would decrease ha and for 3 nights in a row and he slept well. He does request to be on this as it has helped him overall deal with the stress. He also thinks this sertraline has helped with stress/anxiety. He has no side effects and his wife wants him to be on a higher dose. He is okay with using high-dose as well.  Pt used fiorinal and it did not help his ha at all. Pt states he has consistent throbbing ha. No gross motor or sensory function. Now has ha for  been present for past 3-4 weeks. Pain/headache varies in intensity. Presently low level ha. Some ha that wakes him at night. He is staying hydrated during the day. Pt CT of head was negative.(fiorcet did not help with ha. Excedrin migrain did not help)  Pt wife did check his blood pressures. He states she says bp were good. But he did not remember. His wife who is a nurse checked and she states was 130/70. She checked 3 times and each time said was good.   Presently pt has level 1/10 headache.   Pt states 3-4 times a week will wake up from sleep with ha.    Review of Systems  Constitutional: Negative for chills, fatigue and fever.  HENT: Negative for congestion, sinus pain and sinus pressure.   Respiratory: Negative for cough, chest tightness, shortness of breath and wheezing.   Cardiovascular: Negative for chest pain and palpitations.  Gastrointestinal: Negative for abdominal pain and blood in stool.  Musculoskeletal: Negative for back pain, neck pain and neck stiffness.  Skin: Negative for rash.  Neurological: Positive for headaches. Negative for dizziness, syncope, speech difficulty, weakness, light-headedness and numbness.  Hematological:  Negative for adenopathy. Does not bruise/bleed easily.  Psychiatric/Behavioral: Positive for sleep disturbance. Negative for agitation, behavioral problems, confusion, self-injury and suicidal ideas. The patient is nervous/anxious.    Past Medical History:  Diagnosis Date  . Asthma    in past. Rare use about 6 times a year uses inhaler.  Marland Kitchen GERD (gastroesophageal reflux disease)      Social History   Social History  . Marital status: Married    Spouse name: N/A  . Number of children: N/A  . Years of education: N/A   Occupational History  . Not on file.   Social History Main Topics  . Smoking status: Current Every Day Smoker    Types: Cigars  . Smokeless tobacco: Never Used  . Alcohol use Yes     Comment: 12 pack a week.  . Drug use: Yes    Types: Marijuana  . Sexual activity: Yes   Other Topics Concern  . Not on file   Social History Narrative  . No narrative on file    Past Surgical History:  Procedure Laterality Date  . APPENDECTOMY      No family history on file.  No Known Allergies  No current outpatient prescriptions on file prior to visit.   No current facility-administered medications on file prior to visit.     BP 130/70 Comment: checked twice today. espac  Pulse (!) 101   Temp  98.1 F (36.7 C) (Oral)   Resp 16   Ht  (1.803 m)   Wt 186 lb 12.8 oz (84.7 kg)   SpO2 97%   BMI 26.05 kg/m       Objective:   Physical Exam   General  Mental Status - Alert. General Appearance - Well groomed. Not in acute distress.  Skin Rashes- No Rashes.  HEENT Head- Normal. Ear Auditory Canal - Left- Normal. Right - Normal.Tympanic Membrane- Left- Normal. Right- Normal. Eye Sclera/Conjunctiva- Left- Normal. Right- Normal. Nose & Sinuses Nasal Mucosa- Left- Not Boggy and Congested. Right-not  Boggy and  Congested.Bilateral no maxillary and no frontal sinus pressure. Mouth & Throat Lips: Upper Lip- Normal: no dryness, cracking, pallor, cyanosis,  or vesicular eruption. Lower Lip-Normal: no dryness, cracking, pallor, cyanosis or vesicular eruption. Buccal Mucosa- Bilateral- No Aphthous ulcers. Oropharynx- No Discharge or Erythema. Tonsils: Characteristics- Bilateral- No Erythema or Congestion. Size/Enlargement- Bilateral- No enlargement. Discharge- bilateral-None.  Neck Neck- Supple. No Masses.   Chest and Lung Exam Auscultation: Breath Sounds:-Clear even and unlabored.  Cardiovascular Auscultation:Rythm- Regular, rate and rhythm. Murmurs & Other Heart Sounds:Ausculatation of the heart reveal- No Murmurs.  Lymphatic Head & Neck General Head & Neck Lymphatics: Bilateral: Description- No Localized lymphadenopathy.  General Mental Status- Alert. General Appearance- Not in acute distress.   Skin General: Color- Normal Color. Moisture- Normal Moisture.  Neck Carotid Arteries- Normal color. Moisture- Normal Moisture. No carotid bruits. No JVD.  Chest and Lung Exam Auscultation: Breath Sounds:-Normal.  Cardiovascular Auscultation:Rythm- Regular. Murmurs & Other Heart Sounds:Auscultation of the heart reveals- No Murmurs.  Abdomen Inspection:-Inspeection Normal. Palpation/Percussion:Note:No mass. Palpation and Percussion of the abdomen reveal- Non Tender, Non Distended + BS, no rebound or guarding.    Neurologic Cranial Nerve exam:- CN III-XII intact(No nystagmus), symmetric smile. Drift Test:- No drift. Finger to Nose:- Normal/Intact Strength:- 5/5 equal and symmetric strength both upper and lower extremities.      Assessment & Plan:  For your recent severe headaches, I have put in referral for you to see a neurologist. I am trying to get this done as quickly as possible. I have asked referral staff to call around see you can see you the fastest. I have decided to go ahead and try to get MRI of the head scheduled.  For your recent severe headaches, I have put in referral for you to see a neurologist. I am trying  to get this done as quickly as possible. I have asked referral staff to call around to  see who  can see you the fastest. I have decided to go ahead and try to get MRI of the head scheduled.  For your persisting anxiety and stress, I am increasing your sertraline 100 mg a day and refilling your prescription of clonazepam. I do want you to sign control medication contract and give UDS today.  Tonight I want you to try prescription of diclofenac and Flexeril muscle relaxant for the headache.(I would ask that you hold the clonazepam tonight when you use the Flexeril.) I want to know if this combination stops your headache. Please give me update. Pending referral to neurologist, I still advise you to be seen in the emergency department for severe headache or motor/neuro symptoms as we discussed. If you need to be seen in the emergency department recommend [facility] Main ED as they do have MRI capability.  Also would ask that you wife continue to check your blood pressure and make sure your blood pressure is  not hovering around 140/90(or above that level.)  Follow-up in 7-10 days or as needed.  Kollyns Mickelson, Ramon Dredge, PA-C

## 2017-07-10 ENCOUNTER — Ambulatory Visit (HOSPITAL_BASED_OUTPATIENT_CLINIC_OR_DEPARTMENT_OTHER)
Admission: RE | Admit: 2017-07-10 | Discharge: 2017-07-10 | Disposition: A | Discharge status: Home / Self Care | Payer: No Typology Code available for payment source | Source: Ambulatory Visit | Attending: Medical | Admitting: Medical

## 2017-07-10 DIAGNOSIS — G4452 New daily persistent headache (NDPH): Secondary | ICD-10-CM | POA: Diagnosis not present

## 2017-07-10 DIAGNOSIS — R519 Headache, unspecified: Secondary | ICD-10-CM

## 2017-07-10 DIAGNOSIS — R51 Headache: Secondary | ICD-10-CM

## 2017-07-10 DIAGNOSIS — G4459 Other complicated headache syndrome: Secondary | ICD-10-CM | POA: Diagnosis not present

## 2017-07-12 ENCOUNTER — Telehealth: Payer: Self-pay | Admitting: Medical

## 2017-07-12 NOTE — Telephone Encounter (Signed)
Patient's headaches are much less now with some new medication I gave him. And his MRI of the head was negative. I still think neurologist appointment would be a good idea. But you can ask them for 3-4 week timeframe now. Patient wants to delay immediate referral presently.

## 2017-07-15 ENCOUNTER — Encounter: Payer: Self-pay | Admitting: Neurology

## 2017-07-15 ENCOUNTER — Ambulatory Visit (INDEPENDENT_AMBULATORY_CARE_PROVIDER_SITE_OTHER): Payer: No Typology Code available for payment source | Admitting: Neurology

## 2017-07-15 ENCOUNTER — Encounter: Payer: Self-pay | Admitting: Medical

## 2017-07-15 ENCOUNTER — Ambulatory Visit (INDEPENDENT_AMBULATORY_CARE_PROVIDER_SITE_OTHER): Payer: No Typology Code available for payment source | Admitting: Medical

## 2017-07-15 VITALS — BP 130/72 | HR 88 | Temp 98.1°F | Resp 16 | Ht 71.0 in | Wt 186.4 lb

## 2017-07-15 DIAGNOSIS — F439 Reaction to severe stress, unspecified: Secondary | ICD-10-CM

## 2017-07-15 DIAGNOSIS — G4459 Other complicated headache syndrome: Secondary | ICD-10-CM | POA: Diagnosis not present

## 2017-07-15 DIAGNOSIS — R51 Headache: Secondary | ICD-10-CM

## 2017-07-15 DIAGNOSIS — G4489 Other headache syndrome: Secondary | ICD-10-CM | POA: Diagnosis not present

## 2017-07-15 DIAGNOSIS — F419 Anxiety disorder, unspecified: Secondary | ICD-10-CM | POA: Diagnosis not present

## 2017-07-15 DIAGNOSIS — R519 Headache, unspecified: Secondary | ICD-10-CM

## 2017-07-15 HISTORY — DX: Headache, unspecified: R51.9

## 2017-07-15 LAB — PAIN MGMT, PROFILE 8 W/CONF, U
6 ACETYLMORPHINE: NEGATIVE ng/mL (ref <10)
AMINOCLONAZEPAM: 26 ng/mL — AB (ref <25)
AMPHETAMINES: NEGATIVE ng/mL (ref <500)
Alcohol Metabolites: NEGATIVE ng/mL (ref <500)
Alphahydroxyalprazolam: NEGATIVE ng/mL (ref <25)
Alphahydroxymidazolam: NEGATIVE ng/mL (ref <50)
Alphahydroxytriazolam: NEGATIVE ng/mL (ref <50)
Benzodiazepines: POSITIVE ng/mL — AB (ref <100)
Buprenorphine, Urine: NEGATIVE ng/mL (ref <5)
Cocaine Metabolite: NEGATIVE ng/mL (ref <150)
Creatinine: 269.7 mg/dL
Hydroxyethylflurazepam: NEGATIVE ng/mL (ref <50)
Lorazepam: NEGATIVE ng/mL (ref <50)
MDMA: NEGATIVE ng/mL (ref <500)
Marijuana Metabolite: 125 ng/mL — ABNORMAL HIGH (ref <5)
Marijuana Metabolite: POSITIVE ng/mL — AB (ref <20)
Nordiazepam: NEGATIVE ng/mL (ref <50)
OPIATES: NEGATIVE ng/mL (ref <100)
OXAZEPAM: NEGATIVE ng/mL (ref <50)
OXIDANT: NEGATIVE ug/mL (ref <200)
OXYCODONE: NEGATIVE ng/mL (ref <100)
TEMAZEPAM: NEGATIVE ng/mL (ref <50)
pH: 5.95 (ref 4.5–9.0)

## 2017-07-15 MED ORDER — TOPIRAMATE 25 MG PO TABS: ORAL_TABLET | ORAL | 3 refills | Status: AC

## 2017-07-15 MED ORDER — PREDNISONE 5 MG PO TABS
ORAL_TABLET | ORAL | 0 refills | Status: DC
Start: 2017-07-15 — End: 2018-09-19

## 2017-07-15 NOTE — Progress Notes (Signed)
Subjective:    Patient ID: Juan Howard, male    DOB: 01-08-1976, 41 y.o.   MRN: [id number]  HPI  Pt in stating his ha are less intense, less frequent and overall better. His mri was negative. Pt ct of head was negative.  Pt neurologist thinks headache is more related to stress and anxiety. Mri was reviewed. Pt saw neurologist today.  He prescribed topamax and tapered prednisone.  Pt is taking sertraline 100 mg a day. Also wrote for clonazepam.      Review of Systems  Constitutional: Negative for chills, fatigue and fever.  Respiratory: Negative for chest tightness, shortness of breath and wheezing.   Cardiovascular: Negative for chest pain and palpitations.  Gastrointestinal: Negative for abdominal pain, blood in stool, constipation, diarrhea, nausea and vomiting.  Musculoskeletal: Negative for back pain.  Skin: Negative for rash.  Neurological: Negative for dizziness, speech difficulty, weakness and headaches.       No ha presently.  Hematological: Negative for adenopathy. Does not bruise/bleed easily.  Psychiatric/Behavioral: Negative for behavioral problems, confusion, hallucinations and sleep disturbance. The patient is nervous/anxious.        Anxiety better and controlled.   Past Medical History:  Diagnosis Date  . Asthma    in past. Rare use about 6 times a year uses inhaler.  Marland Kitchen GERD (gastroesophageal reflux disease)   . Headache 07/15/2017     Social History   Social History  . Marital status: Married    Spouse name: Alvy Beal   . Number of children: 1  . Years of education: 78   Occupational History  . [employer]     Social History Main Topics  . Smoking status: Current Every Day Smoker    Types: Cigars  . Smokeless tobacco: Never Used     Comment: 2-3 cigars per day  . Alcohol use Yes     Comment: 12 pack a week.  . Drug use: Yes    Types: Marijuana  . Sexual activity: Yes   Other Topics Concern  . Not on file   Social History Narrative   Lives with wife   Caffeine use: 1 cup coffee every morning   4-6 cups pepsi daily   Right handed     Past Surgical History:  Procedure Laterality Date  . APPENDECTOMY      No family history on file.  No Known Allergies  Current Outpatient Prescriptions on File Prior to Visit  Medication Sig Dispense Refill  . clonazePAM (KLONOPIN) 0.5 MG tablet Take 1 tablet (0.5 mg total) by mouth at bedtime. 30 tablet 1  . sertraline (ZOLOFT) 100 MG tablet Take 1 tablet (100 mg total) by mouth daily. 30 tablet 3   No current facility-administered medications on file prior to visit.     BP 130/72   Pulse 88   Temp 98.1 F (36.7 C) (Oral)   Resp 16   Ht  (1.803 m)   Wt 186 lb 6.4 oz (84.6 kg)   SpO2 98%   BMI 26.00 kg/m       Objective:   Physical Exam  General Mental Status- Alert. General Appearance- Not in acute distress.   Skin General: Color- Normal Color. Moisture- Normal Moisture.  Neck Carotid Arteries- Normal color. Moisture- Normal Moisture. No carotid bruits. No JVD.  Chest and Lung Exam Auscultation: Breath Sounds:-Normal.  Cardiovascular Auscultation:Rythm- Regular. Murmurs & Other Heart Sounds:Auscultation of the heart reveals- No Murmurs.   Neurologic Cranial Nerve exam:- CN III-XII  intact(No nystagmus), symmetric smile. Strength:- 5/5 equal and symmetric strength both upper and lower extremities.       Assessment & Plan:  You appear to be doing better with your stress and anxiety. I do want you to continue the sertraline and clonazepam. In the future if you feel much improved and want to consider tapering off of the medication we can do so.  For the headaches would take Topamax and taper prednisone as neurologist recommended today. I do want you to hold/stop the diclofenac and the Flexeril as I want to know how neurologist  medicine regimen will help with your headaches. Also Flexeril going forward would not be a daily medication but would only  be used for trapezius tightness or tension type headache.  Follow-up in 3 months or as needed.  Sarafina Puthoff, Ramon Dredge, PA-C

## 2017-07-15 NOTE — Patient Instructions (Signed)
   We will start prednisone over the next 6 days to get rid of the headache.  We will start Topamax for the headache.  Topamax (topiramate) is a seizure medication that has an FDA approval for seizures and for migraine headache. Potential side effects of this medication include weight loss, cognitive slowing, tingling in the fingers and toes, and carbonated drinks will taste bad. If any significant side effects are noted on this drug, please contact our office.

## 2017-07-15 NOTE — Progress Notes (Signed)
Reason for visit: headache  Referring physician: Dr. [name] is a 41 y.o. male  History of present illness:  Juan Howard is a 41 year old right-handed black male with a history of headaches that began about 4 weeks prior to this evaluation. The patient claims that prior to this he would have only a minor headache once or twice a month that would not require treatment. The patient has begun having daily headaches that are much worse in the evening hours after he lies down. The headache will be about the head and may be associated with some neck stiffness and discomfort. The patient denies any nausea or vomiting or any numbness or weakness on the face, arms, or legs. Once he gets up in the morning, the headache will dissipate within one or 2 hours. The headache may wake him up at night. He does note some blurred vision at times. He has been placed on Lexapro and Zoloft without much benefit. He has taken Fioricet with codeine with no benefit and Excedrin Migraine did not help. He has been placed on clonazepam for anxiety issues, this has helped him sleep better and he has done better with his ability to rest over the last 3 nights. He has undergone MRI of the brain that was unremarkable. He is sent to this office for further evaluation.  Past Medical History:  Diagnosis Date  . Asthma    in past. Rare use about 6 times a year uses inhaler.  Marland Kitchen GERD (gastroesophageal reflux disease)     Past Surgical History:  Procedure Laterality Date  . APPENDECTOMY      History reviewed. No pertinent family history.  Social history:  reports that he has been smoking Cigars.  He has never used smokeless tobacco. He reports that he drinks alcohol. He reports that he uses drugs, including Marijuana.  Medications:  Prior to Admission medications   Medication Sig Start Date End Date Taking? Authorizing Provider  clonazePAM (KLONOPIN) 0.5 MG tablet Take 1 tablet (0.5 mg total) by mouth at  bedtime. 07/08/17  Yes Saguier, Ramon Dredge, PA-C  cyclobenzaprine (FLEXERIL) 10 MG tablet 1 tab po q hs as needed severe ha 07/08/17  Yes Saguier, Ramon Dredge, PA-C  sertraline (ZOLOFT) 100 MG tablet Take 1 tablet (100 mg total) by mouth daily. 07/08/17  Yes Saguier, Ramon Dredge, PA-C  diclofenac (VOLTAREN) 75 MG EC tablet 1 tab po twice daily as needed severe ha Patient not taking: Reported on 07/15/2017 07/08/17   Saguier, Ramon Dredge, PA-C     No Known Allergies  ROS:  Out of a complete 14 system review of symptoms, the patient complains only of the following symptoms, and all other reviewed systems are negative.  Headaches Anxiety  Blood pressure 137/79, pulse 83, height  (1.803 m), weight 186 lb (84.4 kg).  Physical Exam  General: The patient is alert and cooperative at the time of the examination.  Eyes: Pupils are equal, round, and reactive to light. Discs are flat bilaterally.  Neck: The neck is supple, no carotid bruits are noted.  Respiratory: The respiratory examination is clear.  Cardiovascular: The cardiovascular examination reveals a regular rate and rhythm, no obvious murmurs or rubs are noted.  Skin: Extremities are without significant edema.  Neurologic Exam  Mental status: The patient is alert and oriented x 3 at the time of the examination. The patient has apparent normal recent and remote memory, with an apparently normal attention span and concentration ability.  Cranial nerves: Facial  symmetry is present. There is good sensation of the face to pinprick and soft touch bilaterally. The strength of the facial muscles and the muscles to head turning and shoulder shrug are normal bilaterally. Speech is well enunciated, no aphasia or dysarthria is noted. Extraocular movements are full. Visual fields are full. The tongue is midline, and the patient has symmetric elevation of the soft palate. No obvious hearing deficits are noted.  Motor: The motor testing reveals 5 over 5 strength  of all 4 extremities. Good symmetric motor tone is noted throughout.  Sensory: Sensory testing is intact to pinprick, soft touch, vibration sensation, and position sense on all 4 extremities. No evidence of extinction is noted.  Coordination: Cerebellar testing reveals good finger-nose-finger and heel-to-shin bilaterally.  Gait and station: Gait is normal. Tandem gait is normal. Romberg is negative. No drift is seen.  Reflexes: Deep tendon reflexes are symmetric and normal bilaterally. Toes are downgoing bilaterally.   07/10/17 MRI brain:  IMPRESSION: Unremarkable brain MRI.  * MRI scan images were reviewed online. I agree with the written report.    Assessment/Plan:  1. Daily headache  This patient had onset of daily headaches over the last one month. The patient does appear to have some underlying problems with stress and anxiety. He seems to be doing somewhat better with clonazepam which has allowed him to rest better at night. We will start Topamax for his headache, and place him on a six-day prednisone Dosepak. He will follow-up in 3 months, sooner if needed. He will call for any dose adjustments of the medication.   Marlan Palau MD 07/15/2017 8:33 AM  [facility] [address] [place], [place] [address]  Phone [phone] Fax [phone]

## 2017-07-15 NOTE — Patient Instructions (Addendum)
You appear to be doing better with your stress and anxiety. I do want you to continue the sertraline and clonazepam. In the future if you feel much improved and want to consider tapering off of the medication we can do so.  For the headaches would take Topamax and taper prednisone as neurologist recommended today. I do want you to hold/stop the diclofenac and the Flexeril as I want to know how neurologist  medicine regimen will help with your headaches. Also Flexeril going forward would not be a daily medication but would only be used for trapezius tightness or tension type headache.  Follow-up in 3 months or as needed.

## 2017-07-17 ENCOUNTER — Other Ambulatory Visit (HOSPITAL_BASED_OUTPATIENT_CLINIC_OR_DEPARTMENT_OTHER): Payer: No Typology Code available for payment source

## 2017-07-28 ENCOUNTER — Other Ambulatory Visit: Payer: Self-pay | Admitting: Medical

## 2017-10-06 ENCOUNTER — Encounter: Payer: Self-pay | Admitting: Adult Health

## 2017-10-20 ENCOUNTER — Ambulatory Visit: Payer: No Typology Code available for payment source | Admitting: Adult Health

## 2018-09-12 ENCOUNTER — Other Ambulatory Visit: Payer: Self-pay | Admitting: Medical

## 2018-09-19 ENCOUNTER — Encounter: Payer: Self-pay | Admitting: Medical

## 2018-09-19 ENCOUNTER — Ambulatory Visit: Payer: No Typology Code available for payment source | Admitting: Medical

## 2018-09-19 VITALS — BP 138/70 | HR 76 | Temp 97.9°F | Resp 16 | Ht 73.0 in | Wt 190.0 lb

## 2018-09-19 DIAGNOSIS — F32A Depression, unspecified: Secondary | ICD-10-CM

## 2018-09-19 DIAGNOSIS — F329 Major depressive disorder, single episode, unspecified: Secondary | ICD-10-CM

## 2018-09-19 DIAGNOSIS — R6882 Decreased libido: Secondary | ICD-10-CM

## 2018-09-19 DIAGNOSIS — R5383 Other fatigue: Secondary | ICD-10-CM

## 2018-09-19 DIAGNOSIS — N529 Male erectile dysfunction, unspecified: Secondary | ICD-10-CM

## 2018-09-19 DIAGNOSIS — F17200 Nicotine dependence, unspecified, uncomplicated: Secondary | ICD-10-CM

## 2018-09-19 DIAGNOSIS — R030 Elevated blood-pressure reading, without diagnosis of hypertension: Secondary | ICD-10-CM

## 2018-09-19 DIAGNOSIS — F419 Anxiety disorder, unspecified: Secondary | ICD-10-CM

## 2018-09-19 MED ORDER — SERTRALINE HCL 100 MG PO TABS: 100.0000 mg | ORAL_TABLET | Freq: Every day | ORAL | 3 refills | Status: DC

## 2018-09-19 NOTE — Patient Instructions (Addendum)
Your mood/depression with some anxiety has improved with the sertraline.  I did refill same dose.  Also concern initially that you might have had some erectile dysfunction side effect and possibly fatigue from medication.  However, I do doubt medication side effect due to the fact that you have been off the medication for 1 week and do not see any changes in those issues  Did place future labs to be done early in the morning..  These include testosterone panel, CBC, CMP, TSH, B12, B1, and vitamin D.  For history of smoking, do strongly recommend stopping smoking.  We can give you medication potentially in the future to help you stop if needed.  Future order chest x-ray placed today.  Your blood pressure was a little borderline elevated today then rechecked and was better.  Would recommend that by and of January to get complete physical exam with fasting labs.  I will check your cholesterol and sugar at that time.  Follow-up follow-up January for physical or as needed

## 2018-09-19 NOTE — Progress Notes (Signed)
Subjective:    Patient ID: Juan Howard, male    DOB: 01-08-1976, 42 y.o.   MRN: [id number]  HPI   Pt in follow up on anxiety.  Pt has been on sertraline 100 mg q day. He states that does help his mood and anxiety. He does not need any clonazepam. Not reporting any further ha.  He does report some fatigue. He states mild-moderate for about 6 months.  Pt states some occasional ED. Not always but frequent. Out of sertraline for one week and did not effect his libido. Same decrease despite med not being in system.  Pt does smoke. He smokes cigars few during the week. 4-5 cigars a week.     Review of Systems  Constitutional: Negative for chills, fatigue and fever.  HENT: Negative for congestion, ear discharge, ear pain and facial swelling.   Respiratory: Negative for cough, chest tightness, shortness of breath and wheezing.   Cardiovascular: Negative for chest pain and palpitations.  Gastrointestinal: Negative for abdominal pain.  Genitourinary: Negative for decreased urine volume, discharge, dysuria, flank pain, frequency, scrotal swelling and testicular pain.       ED  Neurological: Negative for dizziness, tremors and headaches.  Hematological: Negative for adenopathy.  Psychiatric/Behavioral: Positive for decreased concentration. Negative for behavioral problems, confusion, self-injury and suicidal ideas. The patient is nervous/anxious.        Mood and anxiety is better.    Past Medical History:  Diagnosis Date  . Asthma    in past. Rare use about 6 times a year uses inhaler.  Marland Kitchen GERD (gastroesophageal reflux disease)   . Headache 07/15/2017     Social History   Socioeconomic History  . Marital status: Married    Spouse name: Alvy Beal   . Number of children: 1  . Years of education: 35  . Highest education level: Not on file  Occupational History  . Occupation: Physiological scientist  . Financial resource strain: Not on file  . Food insecurity:    Worry:  Not on file    Inability: Not on file  . Transportation needs:    Medical: Not on file    Non-medical: Not on file  Tobacco Use  . Smoking status: Current Every Day Smoker    Types: Cigars  . Smokeless tobacco: Never Used  . Tobacco comment: 2-3 cigars per day  Substance and Sexual Activity  . Alcohol use: Yes    Comment: 12 pack a week.  . Drug use: Yes    Types: Marijuana  . Sexual activity: Yes  Lifestyle  . Physical activity:    Days per week: Not on file    Minutes per session: Not on file  . Stress: Not on file  Relationships  . Social connections:    Talks on phone: Not on file    Gets together: Not on file    Attends religious service: Not on file    Active member of club or organization: Not on file    Attends meetings of clubs or organizations: Not on file    Relationship status: Not on file  . Intimate partner violence:    Fear of current or ex partner: Not on file    Emotionally abused: Not on file    Physically abused: Not on file    Forced sexual activity: Not on file  Other Topics Concern  . Not on file  Social History Narrative   Lives with wife   Caffeine use:  1 cup coffee every morning   4-6 cups pepsi daily   Right handed     Past Surgical History:  Procedure Laterality Date  . APPENDECTOMY      No family history on file.  No Known Allergies  Current Outpatient Medications on File Prior to Visit  Medication Sig Dispense Refill  . sertraline (ZOLOFT) 100 MG tablet Take 1 tablet (100 mg total) by mouth daily. 30 tablet 3  . topiramate (TOPAMAX) 25 MG tablet Take one tablet at night for one week, then take 2 tablets at night for one week, then take 3 tablets at night. 90 tablet 3  . clonazePAM (KLONOPIN) 0.5 MG tablet Take 1 tablet (0.5 mg total) by mouth at bedtime. (Patient not taking: Reported on 09/19/2018) 30 tablet 1   No current facility-administered medications on file prior to visit.     BP (!) 142/88   Pulse 76   Temp 97.9 F  (36.6 C) (Oral)   Resp 16   Ht 6\' 1"  (1.854 m)   Wt 190 lb (86.2 kg)   SpO2 100%   BMI 25.07 kg/m        Objective:   Physical Exam  General Mental Status- Alert. General Appearance- Not in acute distress.   Skin General: Color- Normal Color. Moisture- Normal Moisture.  Neck Carotid Arteries- Normal color. Moisture- Normal Moisture. No carotid bruits. No JVD.  Chest and Lung Exam Auscultation: Breath Sounds:-Normal.  Cardiovascular Auscultation:Rythm- Regular. Murmurs & Other Heart Sounds:Auscultation of the heart reveals- No Murmurs.  Abdomen Inspection:-Inspeection Normal. Palpation/Percussion:Note:No mass. Palpation and Percussion of the abdomen reveal- Non Tender, Non Distended + BS, no rebound or guarding.    Neurologic Cranial Nerve exam:- CN III-XII intact(No nystagmus), symmetric smile. Strength:- 5/5 equal and symmetric strength both upper and lower extremities.      Assessment & Plan:  Your mood/depression with some anxiety has improved with the sertraline.  I did refill same dose.  Also concern initially that you might have had some erectuke dysfunction side effect and possibly fatigue from medication.  However, I do doubt medication side effect due to the fact that you have been off the medication for 1 week and do not see any changes in those issues  Did place future labs to be done early in the morning..  These include testosterone panel, CBC, CMP, TSH, B12, B1, and vitamin D.  For history of smoking, do strongly recommend stopping smoking.  We can give you medication potentially in the future to help you stop if needed.  Future order chest x-ray placed today.  Your blood pressure was a little borderline elevated today then rechecked and was better.  Would recommend that by and of January to get complete physical exam with fasting labs.  I will check your cholesterol and sugar at that time.  Follow-up follow-up January for physical or as  needed  Whole FoodsEdward Fardowsa Authier, PA-C

## 2019-02-18 ENCOUNTER — Other Ambulatory Visit: Payer: Self-pay | Admitting: Medical

## 2019-02-20 ENCOUNTER — Other Ambulatory Visit: Payer: Self-pay

## 2019-02-20 ENCOUNTER — Ambulatory Visit (INDEPENDENT_AMBULATORY_CARE_PROVIDER_SITE_OTHER): Payer: Self-pay | Admitting: Medical

## 2019-02-20 DIAGNOSIS — R062 Wheezing: Secondary | ICD-10-CM

## 2019-02-20 DIAGNOSIS — F419 Anxiety disorder, unspecified: Secondary | ICD-10-CM

## 2019-02-20 DIAGNOSIS — J301 Allergic rhinitis due to pollen: Secondary | ICD-10-CM

## 2019-02-20 DIAGNOSIS — R030 Elevated blood-pressure reading, without diagnosis of hypertension: Secondary | ICD-10-CM

## 2019-02-20 DIAGNOSIS — K219 Gastro-esophageal reflux disease without esophagitis: Secondary | ICD-10-CM

## 2019-02-20 MED ORDER — SERTRALINE HCL 100 MG PO TABS: 100.0000 mg | ORAL_TABLET | Freq: Every day | ORAL | 3 refills | Status: DC

## 2019-02-20 MED ORDER — OMEPRAZOLE 20 MG PO CPDR: 20.0000 mg | DELAYED_RELEASE_CAPSULE | Freq: Every day | ORAL | 3 refills | Status: DC

## 2019-02-20 MED ORDER — LEVOCETIRIZINE DIHYDROCHLORIDE 5 MG PO TABS: 5.0000 mg | ORAL_TABLET | Freq: Every evening | ORAL | 3 refills | Status: AC

## 2019-02-20 MED ORDER — ALBUTEROL SULFATE HFA 108 (90 BASE) MCG/ACT IN AERS: 2.0000 | INHALATION_SPRAY | Freq: Four times a day (QID) | RESPIRATORY_TRACT | 2 refills | Status: DC | PRN

## 2019-02-20 MED ORDER — MONTELUKAST SODIUM 10 MG PO TABS: 10.0000 mg | ORAL_TABLET | Freq: Every day | ORAL | 3 refills | Status: AC

## 2019-02-20 NOTE — Telephone Encounter (Signed)
Scheduled pt for a vitual at 2:00 this afternoon.

## 2019-02-20 NOTE — Patient Instructions (Signed)
Your blood pressures have been elevated mildly in the past.  Juan Howard would asked that you get your wife to check your blood pressure and pulse later today and call me with readings tomorrow.  That way I can update your vital sign section.  Following your blood pressure is very important.  For seasonal allergies, I did prescribe Xyzal and montelukast.  You have some recent mild wheezing at night associated with a recent allergic rhinitis symptoms.  So went ahead and prescribed/refilled your albuterol.  If using on frequent basis please let us know.  For recent GERD, recommend healthy diet and did prescribe omeprazole.  Your anxiety is well controlled with sertraline.  I refilled that today.  Glad to hear that you do not need any clonazepam.  Follow-up in approximately 3 months or as needed.

## 2019-02-20 NOTE — Progress Notes (Signed)
   Subjective:    Patient ID: Juan Howard, male    DOB: [birth year], 43 y.o.   MRN: [id number]  HPI Virtual Visit via Video Note  I connected with Juan Howard on 02/20/19 at  2:00 PM EDT by a video enabled telemedicine application and verified that I am speaking with the correct person using two identifiers.  Location: Patient: work Provider: home  Pt did not check his bp or pulse.   I discussed the limitations of evaluation and management by telemedicine and the availability of in person appointments. The patient expressed understanding and agreed to proceed.   History of Present Illness: Pt states not that busy at work.  Pt anxiety has been controlled with use of sertraline 100 mg daily. Pt states he does not need any clonazepam. Doing well just on sertraline. No depression reported.  Also has some runny nose, itchy and water eyes. Hx of pollen allergie and works around dust at work.  He states heart burn since quarantine and sheltering in place. He is not eating healthy. Eating out more.  He states some mild wheezing intermittently/rare. Notices some wheeze at night.     Observations/Objective: General- no acute distress. Lungs- even and unlabored breathing. Neuro- gross motor function appears intact.  Assessment and Plan: Your blood pressures have been elevated mildly in the past.  Smiley Houseman would asked that you get your wife to check your blood pressure and pulse later today and call me with readings tomorrow.  That way I can update your vital sign section.  Following your blood pressure is very important.  For seasonal allergies, I did prescribe Xyzal and montelukast.  You have some recent mild wheezing at night associated with a recent allergic rhinitis symptoms.  So went ahead and prescribed/refilled your albuterol.  If using on frequent basis please let us know.  For recent GERD, recommend healthy diet and did prescribe omeprazole.  Your anxiety is well controlled  with sertraline.  I refilled that today.  Glad to hear that you do not need any clonazepam.  Follow-up in approximately 3 months or as needed.  Follow Up Instructions:    I discussed the assessment and treatment plan with the patient. The patient was provided an opportunity to ask questions and all were answered. The patient agreed with the plan and demonstrated an understanding of the instructions.   The patient was advised to call back or seek an in-person evaluation if the symptoms worsen or if the condition fails to improve as anticipated.     Esperanza Richters, PA-C     Review of Systems  Constitutional: Negative for chills, fatigue and fever.  HENT: Positive for congestion, postnasal drip and sneezing. Negative for ear discharge, ear pain, facial swelling and sore throat.   Eyes: Positive for itching.  Respiratory: Positive for wheezing. Negative for cough and shortness of breath.        Minimal wheezing only at night.  Cardiovascular: Negative for chest pain and palpitations.  Gastrointestinal: Negative for abdominal pain.  Genitourinary: Negative for dysuria.  Musculoskeletal: Negative for back pain and myalgias.  Neurological: Negative for dizziness, seizures, syncope and headaches.  Hematological: Negative for adenopathy. Does not bruise/bleed easily.  Psychiatric/Behavioral: Negative for behavioral problems, confusion, dysphoric mood, self-injury, sleep disturbance and suicidal ideas. The patient is nervous/anxious.        Objective:   Physical Exam        Assessment & Plan:

## 2019-03-17 ENCOUNTER — Other Ambulatory Visit: Payer: Self-pay

## 2019-03-17 ENCOUNTER — Emergency Department (HOSPITAL_COMMUNITY)
Admission: EM | Admit: 2019-03-17 | Discharge: 2019-03-18 | Disposition: A | Discharge status: Home / Self Care | Payer: PRIVATE HEALTH INSURANCE | Attending: Emergency Medicine | Admitting: Emergency Medicine

## 2019-03-17 DIAGNOSIS — Z79899 Other long term (current) drug therapy: Secondary | ICD-10-CM | POA: Diagnosis not present

## 2019-03-17 DIAGNOSIS — J45909 Unspecified asthma, uncomplicated: Secondary | ICD-10-CM | POA: Insufficient documentation

## 2019-03-17 DIAGNOSIS — T7840XA Allergy, unspecified, initial encounter: Secondary | ICD-10-CM | POA: Insufficient documentation

## 2019-03-17 DIAGNOSIS — F17290 Nicotine dependence, other tobacco product, uncomplicated: Secondary | ICD-10-CM | POA: Diagnosis not present

## 2019-03-17 LAB — CBC
HCT: 48.1 % (ref 39.0–52.0)
Hemoglobin: 15.8 g/dL (ref 13.0–17.0)
MCH: 31.9 pg (ref 26.0–34.0)
MCHC: 32.8 g/dL (ref 30.0–36.0)
MCV: 97 fL (ref 80.0–100.0)
Platelets: 355 10*3/uL (ref 150–400)
RBC: 4.96 MIL/uL (ref 4.22–5.81)
RDW: 13.2 % (ref 11.5–15.5)
WBC: 12.1 10*3/uL — ABNORMAL HIGH (ref 4.0–10.5)
nRBC: 0 % (ref 0.0–0.2)

## 2019-03-17 MED ORDER — EPINEPHRINE 0.3 MG/0.3ML IJ SOAJ
0.3000 mg | Freq: Once | INTRAMUSCULAR | Status: AC
  Administered 2019-03-17: 23:00:00 0.3 mg via INTRAMUSCULAR
  Filled 2019-03-17: qty 0.3

## 2019-03-17 MED ORDER — METHYLPREDNISOLONE SODIUM SUCC 125 MG IJ SOLR
125.0000 mg | Freq: Once | INTRAMUSCULAR | Status: AC
  Administered 2019-03-17: 23:00:00 125 mg via INTRAVENOUS
  Filled 2019-03-17: qty 2

## 2019-03-17 MED ORDER — FAMOTIDINE IN NACL 20-0.9 MG/50ML-% IV SOLN
20.0000 mg | Freq: Once | INTRAVENOUS | Status: AC
  Administered 2019-03-17: 20 mg via INTRAVENOUS
  Filled 2019-03-17: qty 50

## 2019-03-17 NOTE — ED Notes (Signed)
Bed: UG89 Expected date:  Expected time:  Means of arrival:  Comments: 25M Allergic Rxn to bee, Epi given

## 2019-03-17 NOTE — ED Triage Notes (Signed)
Pt was at home grilling and started itching and tongue began to swell.   Pt not sure what caused the reactions.  EMS gave 0.3mg  of Epi and 50mg  of benadryl.

## 2019-03-17 NOTE — ED Provider Notes (Signed)
TIME SEEN: 11:06 PM  CHIEF COMPLAINT: Possible allergic reaction  HPI: Patient is a 43 year old male with history of depression, asthma who presents to the emergency department with an allergic reaction.  States around 9 PM he was using a charcoal grill to grill steaks and veggie burgers.  States he was sitting under a tree away from the smoke and suddenly started to feel like his eyes were swelling and his lips were swelling.  States he felt like it was difficult to breathe.  He states he was itching all over.  His wife called 911.  EMS gave patient 50 mg of IV Benadryl and 0.3 mg of IM epinephrine.  He states he started feeling better but when he arrived into the ED he started feeling like his throat was closing.  No shortness of breath or wheezing currently.  No chest pain.  No recent fevers or cough.  No vomiting or diarrhea.  States he has not taken his nighttime medications today.  No new medications, soaps, lotions, detergents, foods.  He did not eat the food that he was grilling today.  Has never had this happen to him before.  ROS: See HPI Constitutional: no fever  Eyes: no drainage  ENT: no runny nose   Cardiovascular:  no chest pain  Resp: no SOB  GI: no vomiting GU: no dysuria Integumentary: no rash  Allergy: no hives  Musculoskeletal: no leg swelling  Neurological: no slurred speech ROS otherwise negative  PAST MEDICAL HISTORY/PAST SURGICAL HISTORY:  Past Medical History:  Diagnosis Date  . Asthma    in past. Rare use about 6 times a year uses inhaler.  Marland Kitchen GERD (gastroesophageal reflux disease)   . Headache 07/15/2017    MEDICATIONS:  Prior to Admission medications   Medication Sig Start Date End Date Taking? Authorizing Provider  albuterol (VENTOLIN HFA) 108 (90 Base) MCG/ACT inhaler Inhale 2 puffs into the lungs every 6 (six) hours as needed for wheezing or shortness of breath. 02/20/19   Saguier, Ramon Dredge, PA-C  clonazePAM (KLONOPIN) 0.5 MG tablet Take 1 tablet (0.5 mg  total) by mouth at bedtime. Patient not taking: Reported on 09/19/2018 07/08/17   Saguier, Ramon Dredge, PA-C  levocetirizine (XYZAL) 5 MG tablet Take 1 tablet (5 mg total) by mouth every evening. 02/20/19   Saguier, Ramon Dredge, PA-C  montelukast (SINGULAIR) 10 MG tablet Take 1 tablet (10 mg total) by mouth at bedtime. 02/20/19   Saguier, Ramon Dredge, PA-C  omeprazole (PRILOSEC) 20 MG capsule Take 1 capsule (20 mg total) by mouth daily. 02/20/19   Saguier, Ramon Dredge, PA-C  sertraline (ZOLOFT) 100 MG tablet Take 1 tablet (100 mg total) by mouth daily. 09/19/18   Saguier, Ramon Dredge, PA-C  sertraline (ZOLOFT) 100 MG tablet Take 1 tablet (100 mg total) by mouth daily. 02/20/19   Saguier, Ramon Dredge, PA-C  topiramate (TOPAMAX) 25 MG tablet Take one tablet at night for one week, then take 2 tablets at night for one week, then take 3 tablets at night. 07/15/17   York Spaniel, MD    ALLERGIES:  No Known Allergies  SOCIAL HISTORY:  Social History   Tobacco Use  . Smoking status: Current Every Day Smoker    Types: Cigars  . Smokeless tobacco: Never Used  . Tobacco comment: 2-3 cigars per day  Substance Use Topics  . Alcohol use: Yes    Comment: 12 pack a week.    FAMILY HISTORY: No family history on file.  EXAM: BP 129/80 (BP Location: Right Arm)  Pulse 89   Temp 98.2 F (36.8 C) (Oral)   Resp 15   Ht 5\' 11"  (1.803 m)   Wt 85.3 kg   SpO2 99%   BMI 26.22 kg/m  CONSTITUTIONAL: Alert and oriented and responds appropriately to questions. Well-appearing; well-nourished HEAD: Normocephalic EYES: Conjunctivae clear, pupils appear equal, EOMI, patient has swelling of the upper and lower eyelids bilaterally ENT: normal nose; moist mucous membranes, swelling of upper and lower lips, tongue does not appear swollen, posterior oropharynx appears normal without swelling or redness, no tonsillar hypertrophy or exudate, normal phonation, no stridor or drooling, no trismus NECK: Supple, no meningismus, no nuchal rigidity, no  LAD  CARD: RRR; S1 and S2 appreciated; no murmurs, no clicks, no rubs, no gallops RESP: Normal chest excursion without splinting or tachypnea; breath sounds clear and equal bilaterally; no wheezes, no rhonchi, no rales, no hypoxia or respiratory distress, speaking full sentences ABD/GI: Normal bowel sounds; non-distended; soft, non-tender, no rebound, no guarding, no peritoneal signs, no hepatosplenomegaly BACK:  The back appears normal and is non-tender to palpation, there is no CVA tenderness EXT: Normal ROM in all joints; non-tender to palpation; no edema; normal capillary refill; no cyanosis, no calf tenderness or swelling    SKIN: Normal color for age and race; warm; scattered urticaria to the neck and bilateral upper extremities NEURO: Moves all extremities equally PSYCH: The patient's mood and manner are appropriate. Grooming and personal hygiene are appropriate.  MEDICAL DECISION MAKING: Patient here with allergic reaction.  He has lip swelling, eyelid swelling, urticaria and feels like he is having some difficulty swallowing.  Given second dose of epinephrine here in the emergency department.  Will give IV Solu-Medrol and IV Pepcid.  Will monitor closely.  ED PROGRESS: 12:30 AM  Pt reports feeling much better.  Facial swelling and urticaria have significantly improved.  Hemodynamically stable.  We will continue to closely monitor.  3:10 AM  Pt's facial swelling has completely resolved.  He is resting comfortably and he has been hemodynamically stable here.  I feel he is safe to be discharged home.  His labs show mild leukocytosis which is likely reactive.  Otherwise unremarkable.  Will discharge with prescription for EpiPen, steroid burst, Pepcid.  Have advised him to use Benadryl over-the-counter.  Given outpatient allergy follow-up information.  Patient comfortable with this plan.   At this time, I do not feel there is any life-threatening condition present. I have reviewed and discussed  all results (EKG, imaging, lab, urine as appropriate) and exam findings with patient/family. I have reviewed nursing notes and appropriate previous records.  I feel the patient is safe to be discharged home without further emergent workup and can continue workup as an outpatient as needed. Discussed usual and customary return precautions. Patient/family verbalize understanding and are comfortable with this plan.  Outpatient follow-up has been provided as needed. All questions have been answered.    EKG Interpretation  Date/Time:  Saturday March 18 2019 00:31:12 EDT Ventricular Rate:  82 PR Interval:    QRS Duration: 100 QT Interval:  385 QTC Calculation: 450 R Axis:   113 Text Interpretation:  Sinus rhythm Consider right atrial enlargement Right ventricular hypertrophy No old tracing to compare Confirmed by , Baxter Hire [phone]) on 03/18/2019 12:39:30 AM        CRITICAL CARE Performed by: Baxter Hire    Total critical care time: 43 minutes  Critical care time was exclusive of separately billable procedures and treating other patients.  Critical care was  necessary to treat or prevent imminent or life-threatening deterioration.  Critical care was time spent personally by me on the following activities: development of treatment plan with patient and/or surrogate as well as nursing, discussions with consultants, evaluation of patient's response to treatment, examination of patient, obtaining history from patient or surrogate, ordering and performing treatments and interventions, ordering and review of laboratory studies, ordering and review of radiographic studies, pulse oximetry and re-evaluation of patient's condition.    , Layla MawKristen N, DO 03/18/19 [phone]

## 2019-03-18 LAB — BASIC METABOLIC PANEL
Anion gap: 14 (ref 5–15)
BUN: 13 mg/dL (ref 6–20)
CO2: 19 mmol/L — ABNORMAL LOW (ref 22–32)
Calcium: 8.6 mg/dL — ABNORMAL LOW (ref 8.9–10.3)
Chloride: 101 mmol/L (ref 98–111)
Creatinine, Ser: 0.83 mg/dL (ref 0.61–1.24)
GFR calc Af Amer: >60 mL/min (ref >60)
GFR calc non Af Amer: >60 mL/min (ref >60)
Glucose, Bld: 112 mg/dL — ABNORMAL HIGH (ref 70–99)
Potassium: 3 mmol/L — ABNORMAL LOW (ref 3.5–5.1)
Sodium: 134 mmol/L — ABNORMAL LOW (ref 135–145)

## 2019-03-18 MED ORDER — FAMOTIDINE 20 MG PO TABS: 20.0000 mg | ORAL_TABLET | Freq: Every day | ORAL | 0 refills | Status: AC

## 2019-03-18 MED ORDER — DIPHENHYDRAMINE HCL 25 MG PO TABS: 50.0000 mg | ORAL_TABLET | Freq: Three times a day (TID) | ORAL | 0 refills | Status: AC | PRN

## 2019-03-18 MED ORDER — EPINEPHRINE 0.3 MG/0.3ML IJ SOAJ: 0.3000 mg | INTRAMUSCULAR | 1 refills | Status: DC | PRN

## 2019-03-18 MED ORDER — PREDNISONE 20 MG PO TABS: 60.0000 mg | ORAL_TABLET | Freq: Every day | ORAL | 0 refills | Status: DC

## 2019-06-07 ENCOUNTER — Ambulatory Visit: Payer: Self-pay | Admitting: Medical

## 2020-03-08 ENCOUNTER — Other Ambulatory Visit: Payer: Self-pay | Admitting: Medical

## 2020-08-27 ENCOUNTER — Ambulatory Visit (HOSPITAL_BASED_OUTPATIENT_CLINIC_OR_DEPARTMENT_OTHER)
Admission: RE | Admit: 2020-08-27 | Discharge: 2020-08-27 | Disposition: A | Discharge status: Home / Self Care | Payer: PRIVATE HEALTH INSURANCE | Source: Ambulatory Visit | Attending: Medical | Admitting: Medical

## 2020-08-27 ENCOUNTER — Other Ambulatory Visit: Payer: Self-pay

## 2020-08-27 ENCOUNTER — Telehealth: Payer: Self-pay | Admitting: Medical

## 2020-08-27 ENCOUNTER — Ambulatory Visit (INDEPENDENT_AMBULATORY_CARE_PROVIDER_SITE_OTHER): Payer: PRIVATE HEALTH INSURANCE | Admitting: Medical

## 2020-08-27 VITALS — BP 129/59 | HR 81 | Resp 18 | Ht 71.0 in | Wt 177.0 lb

## 2020-08-27 DIAGNOSIS — F32A Depression, unspecified: Secondary | ICD-10-CM | POA: Diagnosis not present

## 2020-08-27 DIAGNOSIS — M25562 Pain in left knee: Secondary | ICD-10-CM | POA: Insufficient documentation

## 2020-08-27 DIAGNOSIS — Z0000 Encounter for general adult medical examination without abnormal findings: Secondary | ICD-10-CM | POA: Diagnosis not present

## 2020-08-27 DIAGNOSIS — F419 Anxiety disorder, unspecified: Secondary | ICD-10-CM

## 2020-08-27 DIAGNOSIS — F1010 Alcohol abuse, uncomplicated: Secondary | ICD-10-CM

## 2020-08-27 DIAGNOSIS — J4520 Mild intermittent asthma, uncomplicated: Secondary | ICD-10-CM

## 2020-08-27 DIAGNOSIS — K219 Gastro-esophageal reflux disease without esophagitis: Secondary | ICD-10-CM

## 2020-08-27 DIAGNOSIS — Z23 Encounter for immunization: Secondary | ICD-10-CM

## 2020-08-27 DIAGNOSIS — F17200 Nicotine dependence, unspecified, uncomplicated: Secondary | ICD-10-CM

## 2020-08-27 DIAGNOSIS — R1013 Epigastric pain: Secondary | ICD-10-CM

## 2020-08-27 DIAGNOSIS — R5383 Other fatigue: Secondary | ICD-10-CM

## 2020-08-27 LAB — CBC WITH DIFFERENTIAL/PLATELET
Basophils Absolute: 0.1 10*3/uL (ref 0.0–0.1)
Basophils Relative: 1.2 % (ref 0.0–3.0)
Eosinophils Absolute: 0 10*3/uL (ref 0.0–0.7)
Eosinophils Relative: 0.3 % (ref 0.0–5.0)
HCT: 45.9 % (ref 39.0–52.0)
Hemoglobin: 15.2 g/dL (ref 13.0–17.0)
Lymphocytes Relative: 17.2 % (ref 12.0–46.0)
Lymphs Abs: 2 10*3/uL (ref 0.7–4.0)
MCHC: 33.2 g/dL (ref 30.0–36.0)
MCV: 98.3 fl (ref 78.0–100.0)
Monocytes Absolute: 1 10*3/uL (ref 0.1–1.0)
Monocytes Relative: 8.4 % (ref 3.0–12.0)
Neutro Abs: 8.3 10*3/uL — ABNORMAL HIGH (ref 1.4–7.7)
Neutrophils Relative %: 72.9 % (ref 43.0–77.0)
Platelets: 383 10*3/uL (ref 150.0–400.0)
RBC: 4.67 Mil/uL (ref 4.22–5.81)
RDW: 14.4 % (ref 11.5–15.5)
WBC: 11.4 10*3/uL — ABNORMAL HIGH (ref 4.0–10.5)

## 2020-08-27 LAB — TSH: TSH: 1.53 u[IU]/mL (ref 0.35–4.50)

## 2020-08-27 LAB — COMPREHENSIVE METABOLIC PANEL
ALT: 30 U/L (ref 0–53)
AST: 14 U/L (ref 0–37)
Albumin: 4 g/dL (ref 3.5–5.2)
Alkaline Phosphatase: 79 U/L (ref 39–117)
BUN: 9 mg/dL (ref 6–23)
CO2: 27 mEq/L (ref 19–32)
Calcium: 9 mg/dL (ref 8.4–10.5)
Chloride: 103 mEq/L (ref 96–112)
Creatinine, Ser: 0.84 mg/dL (ref 0.40–1.50)
GFR: 107.11 mL/min (ref >60.00)
Glucose, Bld: 88 mg/dL (ref 70–99)
Potassium: 4.8 mEq/L (ref 3.5–5.1)
Sodium: 134 mEq/L — ABNORMAL LOW (ref 135–145)
Total Bilirubin: 0.3 mg/dL (ref 0.2–1.2)
Total Protein: 6.7 g/dL (ref 6.0–8.3)

## 2020-08-27 LAB — T4, FREE: Free T4: 0.92 ng/dL (ref 0.60–1.60)

## 2020-08-27 LAB — LIPID PANEL
Cholesterol: 131 mg/dL (ref 0–200)
HDL: 34.8 mg/dL — ABNORMAL LOW (ref >39.00)
LDL Cholesterol: 84 mg/dL (ref 0–99)
NonHDL: 96.45
Total CHOL/HDL Ratio: 4
Triglycerides: 61 mg/dL (ref 0.0–149.0)
VLDL: 12.2 mg/dL (ref 0.0–40.0)

## 2020-08-27 LAB — LIPASE: Lipase: 14 U/L (ref 11.0–59.0)

## 2020-08-27 LAB — IRON: Iron: 22 ug/dL — ABNORMAL LOW (ref 42–165)

## 2020-08-27 LAB — VITAMIN B12: Vitamin B-12: 266 pg/mL (ref 211–911)

## 2020-08-27 MED ORDER — BUSPIRONE HCL 7.5 MG PO TABS: 7.5000 mg | ORAL_TABLET | Freq: Two times a day (BID) | ORAL | 0 refills | Status: DC

## 2020-08-27 MED ORDER — FERROUS SULFATE 324 (65 FE) MG PO TBEC: DELAYED_RELEASE_TABLET | ORAL | 1 refills | Status: DC

## 2020-08-27 MED ORDER — OMEPRAZOLE 20 MG PO CPDR: 20.0000 mg | DELAYED_RELEASE_CAPSULE | Freq: Every day | ORAL | 3 refills | Status: DC

## 2020-08-27 MED ORDER — ALBUTEROL SULFATE HFA 108 (90 BASE) MCG/ACT IN AERS: INHALATION_SPRAY | RESPIRATORY_TRACT | 0 refills | Status: DC

## 2020-08-27 MED ORDER — TAMSULOSIN HCL 0.4 MG PO CAPS: 0.4000 mg | ORAL_CAPSULE | Freq: Every day | ORAL | 0 refills | Status: DC

## 2020-08-27 NOTE — Telephone Encounter (Signed)
Sent in iron tablet. Put on that rx cancel flomax. Advise MA to call pharmacy and cancel out flomax as well.

## 2020-08-27 NOTE — Telephone Encounter (Signed)
Canceled the flomax prescription.

## 2020-08-27 NOTE — Progress Notes (Signed)
Subjective:    Patient ID: Juan Howard, male    DOB: 1976/01/16, 44 y.o.   MRN: [id number]  HPI  Pt in for cpe/wellness exam. He is fasting.  Pt declines flu vaccine.  Pt will get tdap today.   Pt is fasting. Pt has not been exercising. Admits over past 2 years was drinking alcohol to heavy. He stopped alcohol for about 2 months. He lost weight to 158 lb. He was depresses with brother cousin and step mom passing. Pt stopped drinking and has gained weight 170 lb. His appetite has picked up.    When he was drinking 1/5-liter of rum a day before he quit. Pt states he started to get some stomach pain and saw some bright red blood around time hemorrhoids flared. No alcohol for one month.   See hpi on left knee pain.          Review of Systems  Constitutional: Positive for fatigue. Negative for chills and fever.  Respiratory: Negative for cough, chest tightness, shortness of breath and wheezing.   Cardiovascular: Negative for chest pain and palpitations.  Gastrointestinal: Negative for abdominal pain.  Musculoskeletal:       Left knee pain- pain moderat to severe for past 8 months. Hurts to cut grass or take short walks.   Skin: Negative for rash.  Neurological: Negative for dizziness, numbness and headaches.  Hematological: Negative for adenopathy. Does not bruise/bleed easily.  Psychiatric/Behavioral: Positive for dysphoric mood. Negative for behavioral problems, decreased concentration, sleep disturbance and suicidal ideas. The patient is nervous/anxious.        Hx of some anxiety and most recently had some depression.  Pt states his anxiety is worse presently. Depression has improved a lot.  He want to try something different other than sertraline.    Past Medical History:  Diagnosis Date  . Asthma    in past. Rare use about 6 times a year uses inhaler.  Marland Kitchen GERD (gastroesophageal reflux disease)   . Headache 07/15/2017     Social History   Socioeconomic History    . Marital status: Married    Spouse name: Alvy Beal   . Number of children: 1  . Years of education: 41  . Highest education level: Not on file  Occupational History  . Occupation: Science writer   Tobacco Use  . Smoking status: Current Every Day Smoker    Types: Cigars  . Smokeless tobacco: Never Used  . Tobacco comment: 2-3 cigars per day  Vaping Use  . Vaping Use: Never used  Substance and Sexual Activity  . Alcohol use: Yes    Comment: 12 pack a week.  . Drug use: Yes    Types: Marijuana  . Sexual activity: Yes  Other Topics Concern  . Not on file  Social History Narrative   Lives with wife   Caffeine use: 1 cup coffee every morning   4-6 cups pepsi daily   Right handed    Social Determinants of Health   Financial Resource Strain:   . Difficulty of Paying Living Expenses: Not on file  Food Insecurity:   . Worried About Programme researcher, broadcasting/film/video in the Last Year: Not on file  . Ran Out of Food in the Last Year: Not on file  Transportation Needs:   . Lack of Transportation (Medical): Not on file  . Lack of Transportation (Non-Medical): Not on file  Physical Activity:   . Days of Exercise per Week: Not on file  .  Minutes of Exercise per Session: Not on file  Stress:   . Feeling of Stress : Not on file  Social Connections:   . Frequency of Communication with Friends and Family: Not on file  . Frequency of Social Gatherings with Friends and Family: Not on file  . Attends Religious Services: Not on file  . Active Member of Clubs or Organizations: Not on file  . Attends Banker Meetings: Not on file  . Marital Status: Not on file  Intimate Partner Violence:   . Fear of Current or Ex-Partner: Not on file  . Emotionally Abused: Not on file  . Physically Abused: Not on file  . Sexually Abused: Not on file    Past Surgical History:  Procedure Laterality Date  . APPENDECTOMY      No family history on file.  No Known Allergies  Current Outpatient  Medications on File Prior to Visit  Medication Sig Dispense Refill  . EPINEPHrine 0.3 mg/0.3 mL IJ SOAJ injection Inject 0.3 mLs (0.3 mg total) into the muscle as needed for anaphylaxis. 1 Device 1  . famotidine (PEPCID) 20 MG tablet Take 1 tablet (20 mg total) by mouth daily. 5 tablet 0  . levocetirizine (XYZAL) 5 MG tablet Take 1 tablet (5 mg total) by mouth every evening. 30 tablet 3  . omeprazole (PRILOSEC) 20 MG capsule Take 1 capsule (20 mg total) by mouth daily. 30 capsule 3  . sertraline (ZOLOFT) 100 MG tablet Take 1 tablet (100 mg total) by mouth daily. 30 tablet 3  . sertraline (ZOLOFT) 100 MG tablet Take 1 tablet (100 mg total) by mouth daily. 30 tablet 3  . topiramate (TOPAMAX) 25 MG tablet Take one tablet at night for one week, then take 2 tablets at night for one week, then take 3 tablets at night. 90 tablet 3  . clonazePAM (KLONOPIN) 0.5 MG tablet Take 1 tablet (0.5 mg total) by mouth at bedtime. (Patient not taking: Reported on 09/19/2018) 30 tablet 1  . diphenhydrAMINE (BENADRYL) 25 MG tablet Take 2 tablets (50 mg total) by mouth every 8 (eight) hours as needed for itching or allergies. (Patient not taking: Reported on 08/27/2020) 30 tablet 0  . montelukast (SINGULAIR) 10 MG tablet Take 1 tablet (10 mg total) by mouth at bedtime. (Patient not taking: Reported on 08/27/2020) 30 tablet 3  . predniSONE (DELTASONE) 20 MG tablet Take 3 tablets (60 mg total) by mouth daily. (Patient not taking: Reported on 08/27/2020) 15 tablet 0  . VENTOLIN HFA 108 (90 Base) MCG/ACT inhaler TAKE 2 PUFFS BY MOUTH EVERY 6 HOURS AS NEEDED FOR WHEEZE OR SHORTNESS OF BREATH (Patient not taking: Reported on 08/27/2020) 18 g 0   No current facility-administered medications on file prior to visit.    BP (!) 129/59   Pulse 81   Resp 18   Ht 5\' 11"  (1.803 m)   Wt 177 lb (80.3 kg)   SpO2 99%   BMI 24.69 kg/m       Objective:   Physical Exam  General Mental Status- Alert. General Appearance- Not in  acute distress.   Skin General: Color- Normal Color. Moisture- Normal Moisture.  Neck Carotid Arteries- Normal color. Moisture- Normal Moisture. No carotid bruits. No JVD.  Chest and Lung Exam Auscultation: Breath Sounds:-Normal.  Cardiovascular Auscultation:Rythm- Regular. Murmurs & Other Heart Sounds:Auscultation of the heart reveals- No Murmurs.  Abdomen Inspection:-Inspeection Normal. Palpation/Percussion:Note:No mass. Palpation and Percussion of the abdomen reveal- Non Tender, Non Distended + BS, no  rebound or guarding.    Neurologic Cranial Nerve exam:- CN III-XII intact(No nystagmus), symmetric smile. Strength:- 5/5 equal and symmetric strength both upper and lower extremities.  Left knee-good range of motion no crepitus on exam.  No instability.     Assessment & Plan:  For you wellness exam today I have ordered cbc, cmp, tsh and  lipid panel.  Tdap today.  Recommend exercise and healthy diet.  We will let you know lab results as they come in.  For recent fatigue will add TSH, T4, B12, B1, iron and vitamin D.  Recent depression and anxiety.  Glad to hear depression component resolved but still feeling anxious.  After discussion on options chose BuSpar low-dose.  We will see how you do over the next month and might need to increase to a higher dose on follow-up.  For GERD history, refilled omeprazole.  Eat healthy diet as discussed.  Intermittent asthma in the past.  Refilled your albuterol.  If you have finding that you have to use albuterol frequently let us know.  History of smoking recent minimal only on weekends.  Recommend that you stop smoking.  Wellbutrin is an option in the future if you want to try that.  Wellbutrin can also treat depression.  Recent alcohol abuse but stopped any alcohol 1 month ago.  Continue abstinence.  Counseled on adverse effects of alcohol.  Will check lipase enzyme as he did describe abdomen discomfort before you start to  use.  Follow-up in 1 month or as needed.  Esperanza Richters, [place]   [id number] charge as addressed fatigue, knee pain, depression/anxiety, GERD, asthma, smoking and alcohol abuse.

## 2020-08-27 NOTE — Telephone Encounter (Deleted)
Rx flomax sent to pt pharmacy. 

## 2020-08-27 NOTE — Addendum Note (Signed)
Addended by: Maximino Sarin on: 08/27/2020 10:48 AM   Modules accepted: Orders

## 2020-08-27 NOTE — Patient Instructions (Addendum)
For you wellness exam today I have ordered cbc, cmp, tsh and  lipid panel.  Tdap today.  Recommend exercise and healthy diet.  We will let you know lab results as they come in.  For recent fatigue will add TSH, T4, B12, B1, iron and vitamin D.  For left knee pain get x-ray today.  After x-ray review might refer to sports medicine.  Recent depression and anxiety.  Glad to hear depression component resolved but still feeling anxious.  After discussion on options chose BuSpar low-dose.  We will see how you do over the next month and might need to increase to a higher dose on follow-up.  For GERD history, refilled omeprazole.  Eat healthy diet as discussed.  Intermittent asthma in the past.  Refilled your albuterol.  If you have finding that you have to use albuterol frequently let us know.  History of smoking recent minimal only on weekends.  Recommend that you stop smoking.  Wellbutrin is an option in the future if you want to try that.  Wellbutrin can also treat depression.  Recent alcohol abuse but stopped any alcohol 1 month ago.  Continue abstinence.  Counseled on adverse effects of alcohol.  Will check lipase enzyme as he did describe abdomen discomfort before you start to use.  Follow-up in 1 month or as needed.  Follow up date appointment will be determined after lab review.

## 2020-08-27 NOTE — Telephone Encounter (Signed)
Accidentally opened up pt chart.

## 2020-08-30 LAB — VITAMIN B1: Vitamin B1 (Thiamine): 6 nmol/L — ABNORMAL LOW (ref 8–30)

## 2020-09-13 ENCOUNTER — Other Ambulatory Visit: Payer: Self-pay | Admitting: Medical

## 2020-09-18 ENCOUNTER — Other Ambulatory Visit: Payer: Self-pay | Admitting: Medical

## 2020-09-22 ENCOUNTER — Other Ambulatory Visit: Payer: Self-pay | Admitting: Medical

## 2020-09-26 ENCOUNTER — Other Ambulatory Visit: Payer: Self-pay

## 2020-09-26 ENCOUNTER — Telehealth (INDEPENDENT_AMBULATORY_CARE_PROVIDER_SITE_OTHER): Payer: PRIVATE HEALTH INSURANCE | Admitting: Medical

## 2020-09-26 VITALS — Ht 71.0 in | Wt 177.0 lb

## 2020-09-26 DIAGNOSIS — F419 Anxiety disorder, unspecified: Secondary | ICD-10-CM

## 2020-09-26 DIAGNOSIS — E519 Thiamine deficiency, unspecified: Secondary | ICD-10-CM | POA: Diagnosis not present

## 2020-09-26 DIAGNOSIS — K219 Gastro-esophageal reflux disease without esophagitis: Secondary | ICD-10-CM

## 2020-09-26 MED ORDER — BUSPIRONE HCL 7.5 MG PO TABS: 7.5000 mg | ORAL_TABLET | Freq: Two times a day (BID) | ORAL | 0 refills | Status: DC

## 2020-09-26 NOTE — Progress Notes (Signed)
   Subjective:    Patient ID: Juan Howard, male    DOB: 05/16/1976, 44 y.o.   MRN: [id number]  HPI  Virtual Visit via Video Note  I connected with Juan Howard on 09/26/20 at  9:40 AM EST by a video enabled telemedicine application and verified that I am speaking with the correct person using two identifiers.  Location: Patient: home Provider: office   I discussed the limitations of evaluation and management by telemedicine and the availability of in person appointments. The patient expressed understanding and agreed to proceed.  Pt did not chec his bp or pulse.   History of Present Illness: Pt in for follow up. He has hx of anxiety and depression. On last visit gave buspar low dose.  Pt states his anxiety is improved. Pt is only on buspar. He feels that it is adequate.  Not reporting any depression recently.  He is no longer taking sertraline.  Pt states no alcohol use for 2 months. Stopped completely. He has gained 7 lb since last time I saw him.   On last labs low B1 and lower and B12 level.  Patient has started B12 but did not get B1 vitamin yet.  His reflux is all well controlled.        Observations/Objective:  General-no acute distress, pleasant, oriented. Lungs- on inspection lungs appear unlabored. Neck- no tracheal deviation or jvd on inspection. Neuro- gross motor function appears intact.  Assessment and Plan: Glad to hear that low-dose BuSpar is adequate to control your anxiety.  Refilling prescription today.  Depression presently not an issue.  History of GERD.  Controlled with omeprazole.  B1 vitamin deficiency and lower and B12 level.  Recommend supplementing with over-the-counter B12 vitamin and B1 vitamin.  Now that you have stopped drinking alcohol completely for 2 months I suspect that your vitamin levels will increase quickly.  Follow-up in 6 months or as needed.  We will repeat vitamin levels on follow-up appointment.  Follow Up  Instructions:    I discussed the assessment and treatment plan with the patient. The patient was provided an opportunity to ask questions and all were answered. The patient agreed with the plan and demonstrated an understanding of the instructions.   The patient was advised to call back or seek an in-person evaluation if the symptoms worsen or if the condition fails to improve as anticipated.  .Time spent with patient today was 20  minutes which consisted of chart review, discussing diagnosis,  treatment and documentation.   Esperanza Richters, PA-C   Review of Systems  Constitutional: Negative for chills and fatigue.  Respiratory: Negative for chest tightness, shortness of breath and wheezing.   Cardiovascular: Negative for chest pain and palpitations.  Gastrointestinal: Negative for abdominal pain.  Musculoskeletal: Negative for back pain.  Skin: Negative for rash.  Hematological: Negative for adenopathy. Does not bruise/bleed easily.  Psychiatric/Behavioral: Positive for dysphoric mood. Negative for behavioral problems and sleep disturbance. The patient is nervous/anxious.        Objective:   Physical Exam        Assessment & Plan:

## 2020-09-26 NOTE — Patient Instructions (Addendum)
Glad to hear that low-dose BuSpar is adequate to control your anxiety.  Refilling prescription today.  Depression presently not an issue.  History of GERD.  Controlled with omeprazole.  B1 vitamin deficiency and lower and B12 level.  Recommend supplementing with over-the-counter B12 vitamin and B1 vitamin.  Now that you have stopped drinking alcohol completely for 2 months I suspect that your vitamin levels will increase quickly.  Follow-up in 6 months or as needed.  We will repeat vitamin levels on follow-up appointment.

## 2020-10-08 ENCOUNTER — Other Ambulatory Visit: Payer: Self-pay | Admitting: Medical

## 2020-11-14 DIAGNOSIS — Z03818 Encounter for observation for suspected exposure to other biological agents ruled out: Secondary | ICD-10-CM | POA: Diagnosis not present

## 2020-11-14 DIAGNOSIS — Z20822 Contact with and (suspected) exposure to covid-19: Secondary | ICD-10-CM | POA: Diagnosis not present

## 2020-12-20 ENCOUNTER — Telehealth: Payer: Self-pay | Admitting: Medical

## 2020-12-20 MED ORDER — ALBUTEROL SULFATE HFA 108 (90 BASE) MCG/ACT IN AERS: INHALATION_SPRAY | RESPIRATORY_TRACT | 0 refills | Status: DC

## 2020-12-20 NOTE — Telephone Encounter (Signed)
Rx albuterol sent to pt pharmacy. 

## 2021-01-03 IMAGING — DX DG KNEE 3 VIEWS*L*
3 series · 3 of 3 positions shown · non-contrast
Comparison: None.

CLINICAL DATA: Left knee pain. Pain localizes to the patella. No
known injury.

EXAM:
LEFT KNEE - 3 VIEW

[knee ap]
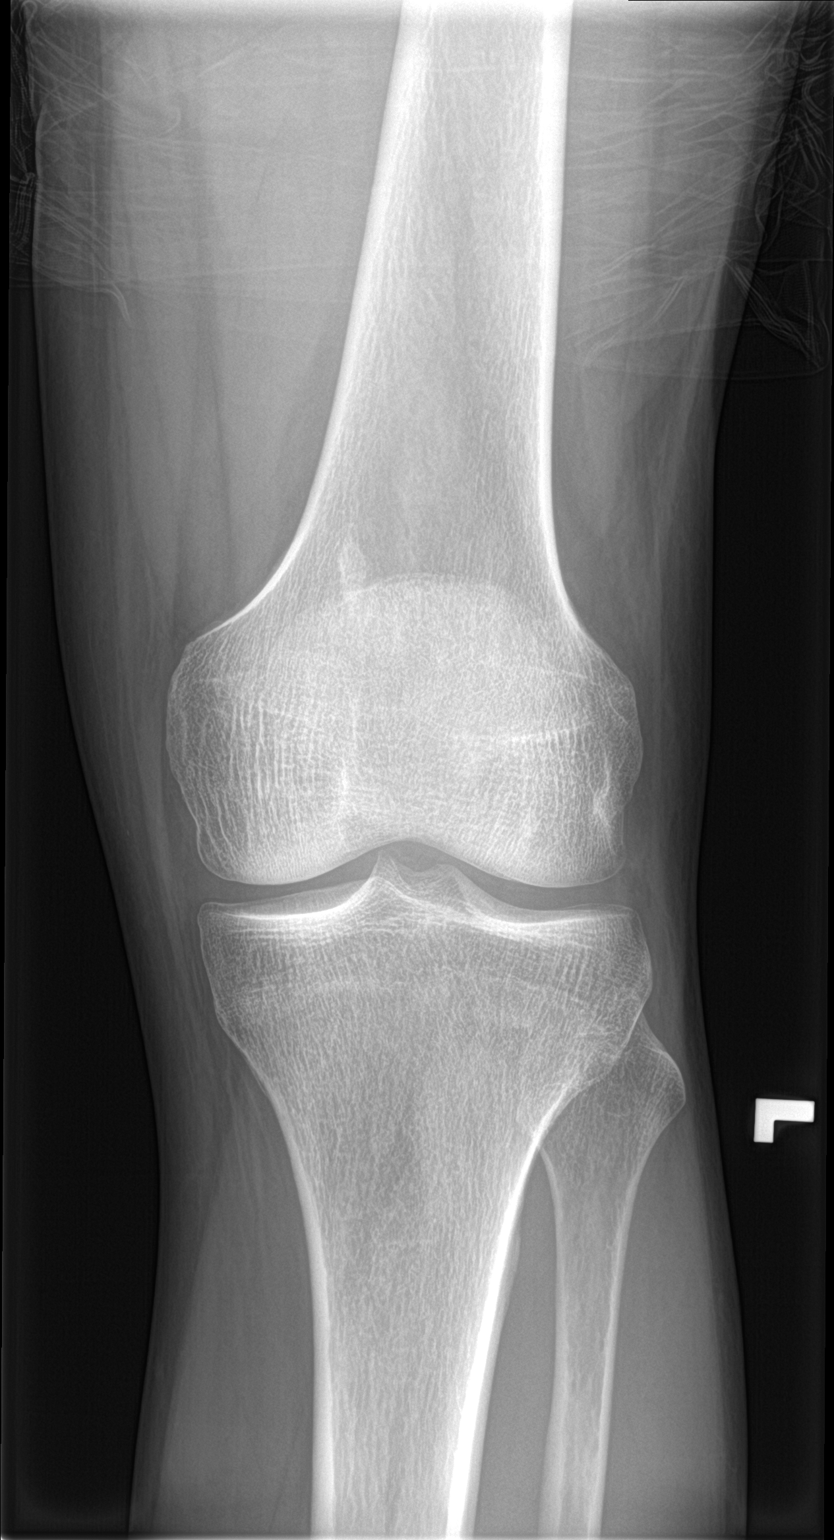

[knee lat]
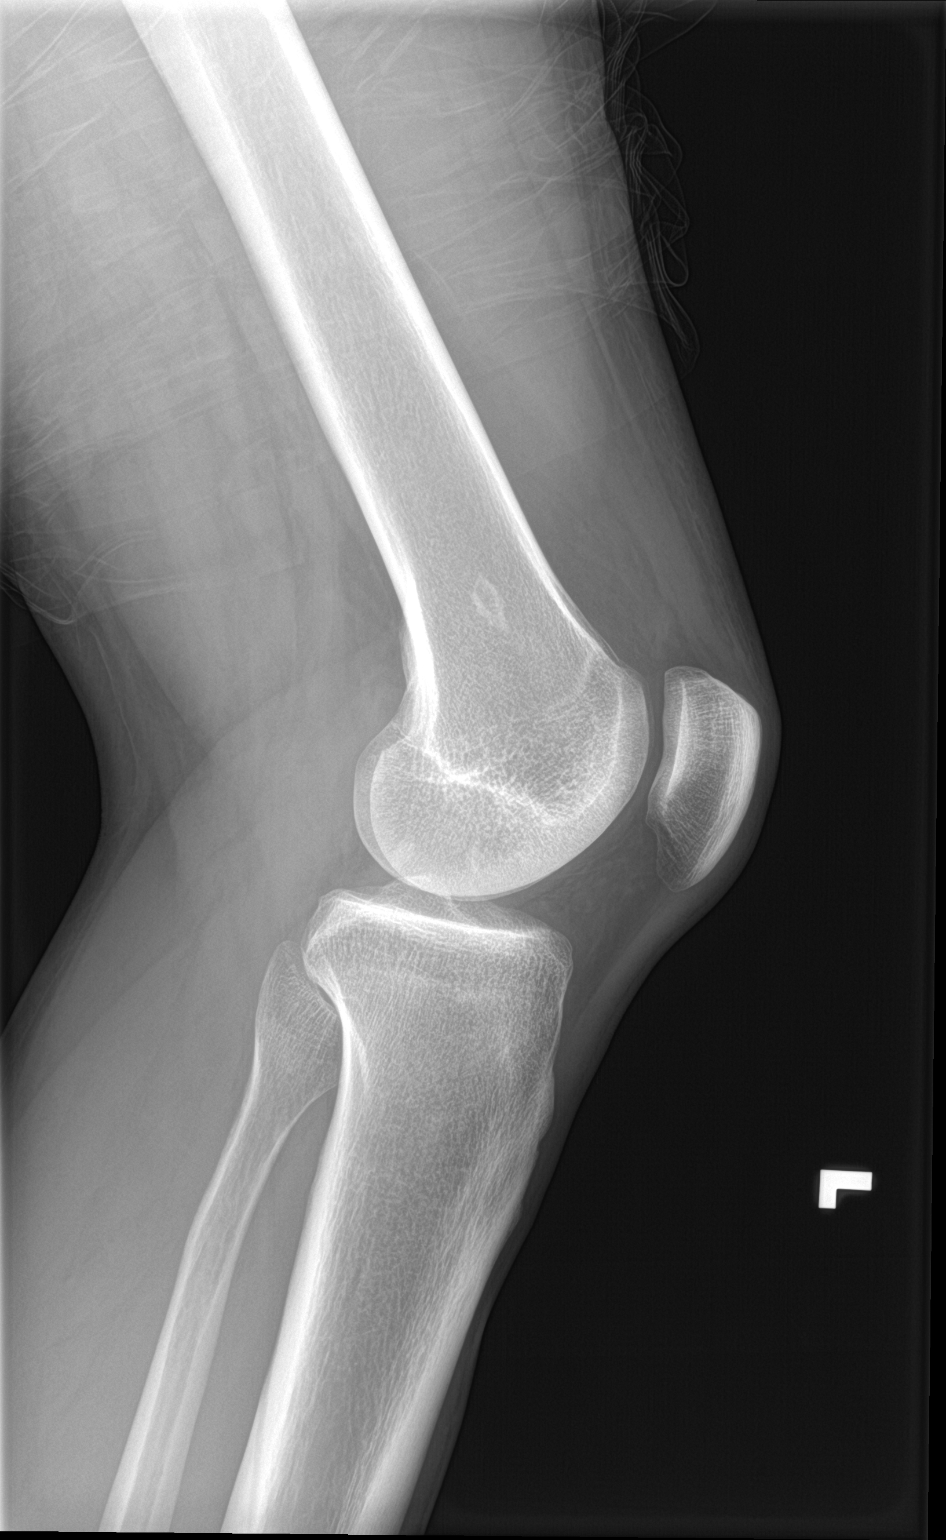

[knee sunrise]
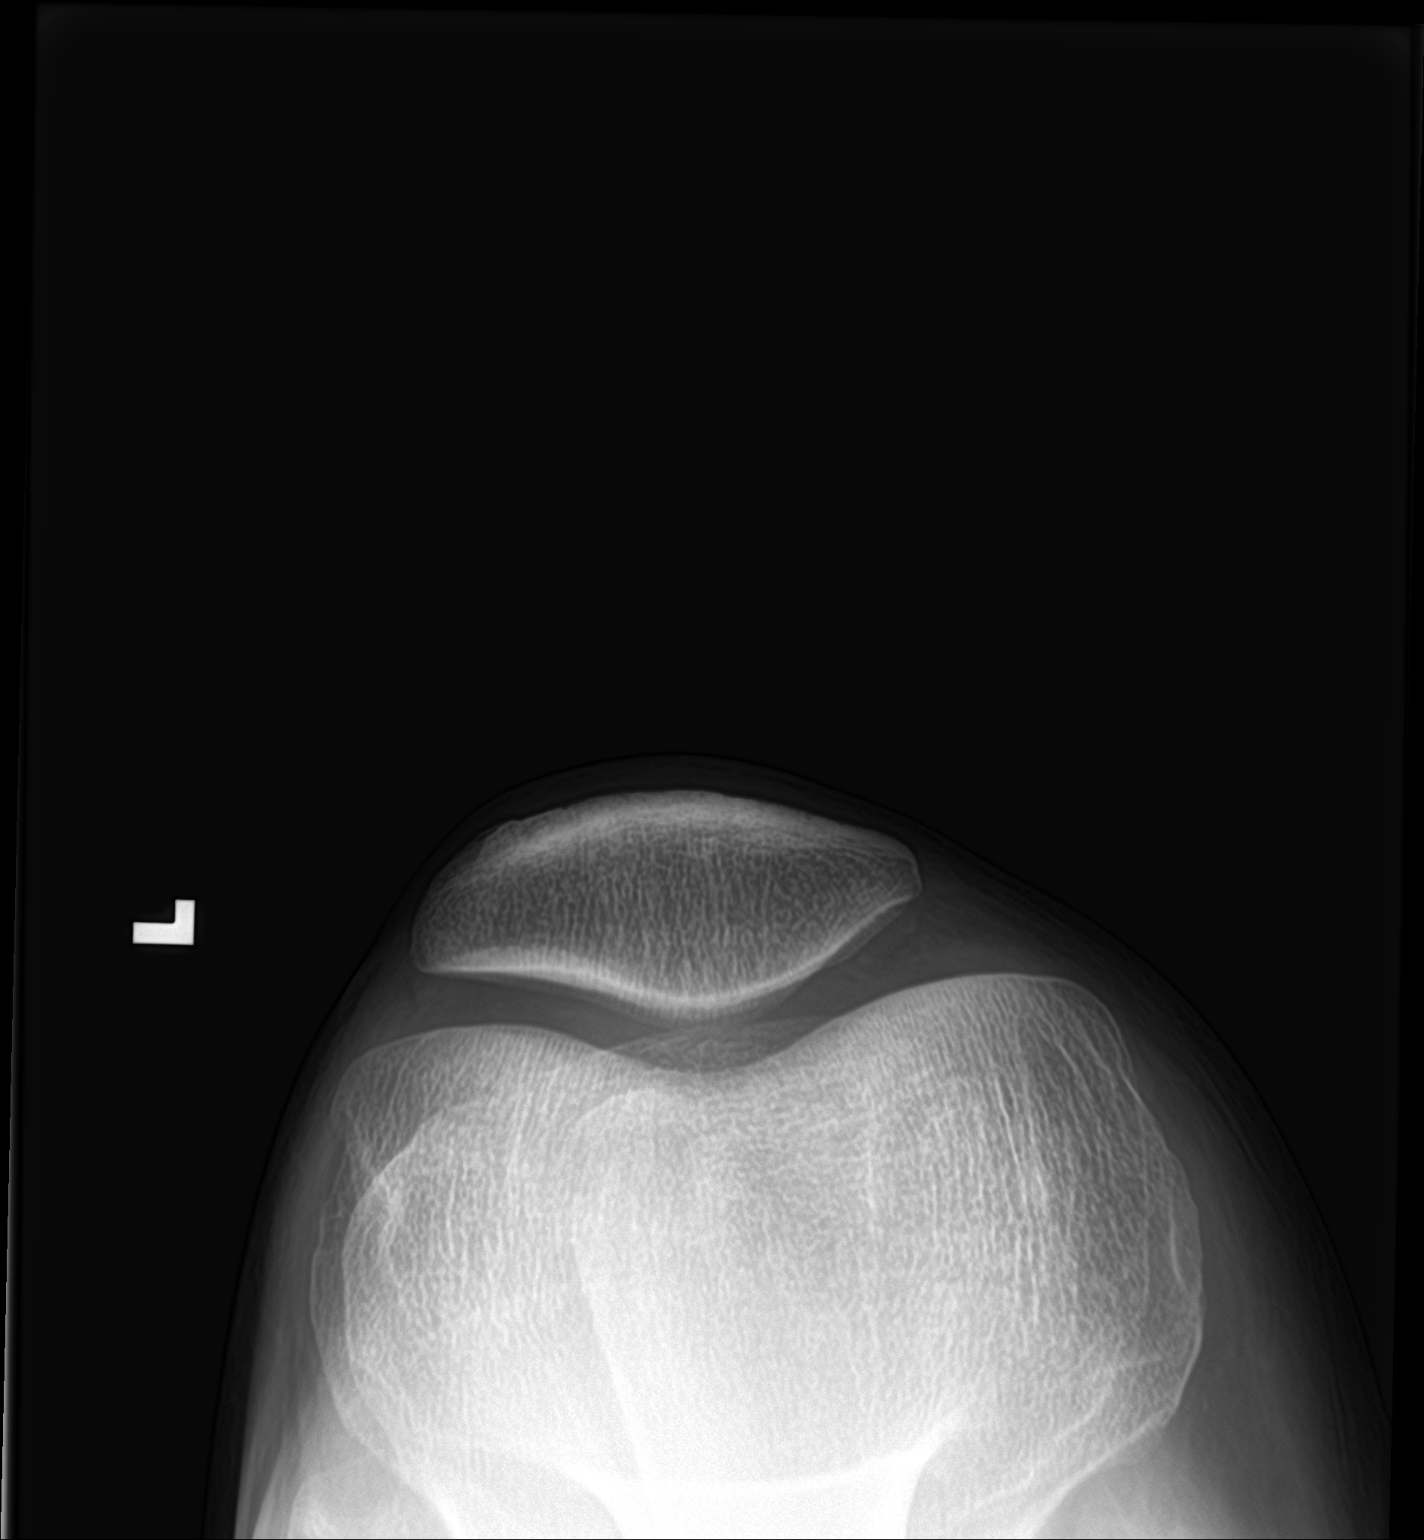

[3 of 3 positions shown; findings below may reference images not displayed]

FINDINGS: No joint effusion. No fracture or dislocation. Joint spaces are well
preserved. Soft tissues are unremarkable.
IMPRESSION: Normal exam.

## 2021-01-11 ENCOUNTER — Other Ambulatory Visit: Payer: Self-pay | Admitting: Medical

## 2021-01-23 ENCOUNTER — Other Ambulatory Visit: Payer: Self-pay | Admitting: Medical

## 2021-02-19 ENCOUNTER — Other Ambulatory Visit: Payer: Self-pay | Admitting: Medical

## 2021-03-20 ENCOUNTER — Other Ambulatory Visit: Payer: Self-pay | Admitting: Medical

## 2021-05-23 ENCOUNTER — Other Ambulatory Visit: Payer: Self-pay | Admitting: Medical

## 2021-12-09 ENCOUNTER — Ambulatory Visit: Payer: PRIVATE HEALTH INSURANCE | Admitting: Medical

## 2021-12-11 ENCOUNTER — Ambulatory Visit: Payer: BLUE CROSS/BLUE SHIELD | Admitting: Medical

## 2021-12-11 VITALS — BP 146/70 | HR 95 | Temp 98.0°F | Resp 18 | Ht 71.0 in | Wt 163.0 lb

## 2021-12-11 DIAGNOSIS — E611 Iron deficiency: Secondary | ICD-10-CM

## 2021-12-11 DIAGNOSIS — J4520 Mild intermittent asthma, uncomplicated: Secondary | ICD-10-CM

## 2021-12-11 DIAGNOSIS — M546 Pain in thoracic spine: Secondary | ICD-10-CM

## 2021-12-11 DIAGNOSIS — M545 Low back pain, unspecified: Secondary | ICD-10-CM

## 2021-12-11 DIAGNOSIS — R5383 Other fatigue: Secondary | ICD-10-CM

## 2021-12-11 DIAGNOSIS — R1013 Epigastric pain: Secondary | ICD-10-CM | POA: Diagnosis not present

## 2021-12-11 DIAGNOSIS — R252 Cramp and spasm: Secondary | ICD-10-CM | POA: Diagnosis not present

## 2021-12-11 LAB — CBC WITH DIFFERENTIAL/PLATELET
Basophils Absolute: 0.1 10*3/uL (ref 0.0–0.1)
Basophils Relative: 0.8 % (ref 0.0–3.0)
Eosinophils Absolute: 0.1 10*3/uL (ref 0.0–0.7)
Eosinophils Relative: 0.5 % (ref 0.0–5.0)
HCT: 47.1 % (ref 39.0–52.0)
Hemoglobin: 16.1 g/dL (ref 13.0–17.0)
Lymphocytes Relative: 19.3 % (ref 12.0–46.0)
Lymphs Abs: 1.9 10*3/uL (ref 0.7–4.0)
MCHC: 34.2 g/dL (ref 30.0–36.0)
MCV: 101.5 fl — ABNORMAL HIGH (ref 78.0–100.0)
Monocytes Absolute: 0.9 10*3/uL (ref 0.1–1.0)
Monocytes Relative: 8.8 % (ref 3.0–12.0)
Neutro Abs: 7 10*3/uL (ref 1.4–7.7)
Neutrophils Relative %: 70.6 % (ref 43.0–77.0)
Platelets: 258 10*3/uL (ref 150.0–400.0)
RBC: 4.65 Mil/uL (ref 4.22–5.81)
RDW: 13.9 % (ref 11.5–15.5)
WBC: 9.9 10*3/uL (ref 4.0–10.5)

## 2021-12-11 LAB — TSH: TSH: 2.87 u[IU]/mL (ref 0.35–5.50)

## 2021-12-11 LAB — COMPREHENSIVE METABOLIC PANEL
ALT: 31 U/L (ref 0–53)
AST: 22 U/L (ref 0–37)
Albumin: 4.2 g/dL (ref 3.5–5.2)
Alkaline Phosphatase: 77 U/L (ref 39–117)
BUN: 9 mg/dL (ref 6–23)
CO2: 27 mEq/L (ref 19–32)
Calcium: 9.5 mg/dL (ref 8.4–10.5)
Chloride: 101 mEq/L (ref 96–112)
Creatinine, Ser: 0.81 mg/dL (ref 0.40–1.50)
GFR: 107.31 mL/min (ref >60.00)
Glucose, Bld: 88 mg/dL (ref 70–99)
Potassium: 4.5 mEq/L (ref 3.5–5.1)
Sodium: 135 mEq/L (ref 135–145)
Total Bilirubin: 0.4 mg/dL (ref 0.2–1.2)
Total Protein: 6.9 g/dL (ref 6.0–8.3)

## 2021-12-11 LAB — LIPASE: Lipase: 16 U/L (ref 11.0–59.0)

## 2021-12-11 LAB — MAGNESIUM: Magnesium: 1.8 mg/dL (ref 1.5–2.5)

## 2021-12-11 LAB — IRON: Iron: 32 ug/dL — ABNORMAL LOW (ref 42–165)

## 2021-12-11 LAB — T4, FREE: Free T4: 0.81 ng/dL (ref 0.60–1.60)

## 2021-12-11 MED ORDER — FERROUS SULFATE 324 (65 FE) MG PO TBEC: 1.0000 | DELAYED_RELEASE_TABLET | Freq: Two times a day (BID) | ORAL | 1 refills | Status: AC

## 2021-12-11 MED ORDER — ALBUTEROL SULFATE HFA 108 (90 BASE) MCG/ACT IN AERS: INHALATION_SPRAY | RESPIRATORY_TRACT | 2 refills | Status: DC

## 2021-12-11 MED ORDER — OMEPRAZOLE 20 MG PO CPDR: 20.0000 mg | DELAYED_RELEASE_CAPSULE | Freq: Every day | ORAL | 3 refills | Status: DC

## 2021-12-11 NOTE — Addendum Note (Signed)
Addended by: Gwenevere Abbot on: 12/11/2021 07:24 PM ? ? Modules accepted: Orders ? ?

## 2021-12-11 NOTE — Patient Instructions (Addendum)
For leg cramps will get cmp and mg level. Stretch calfs and avoid dehydration. Cut back on alcohol use. Over spring and summer if sweating a lot whole working use sugar free gatorade or use [employer]el fitness water. ? ?Low iron in past recheck iron. ? ?Fatigue- will check cbc, cmp and thyorid studies. ? ?Recent abd pain resolved. Likely related to diet/food you at that day. Eat healthy diet and if on occasional pain after eating try omeprazole. Will add lipase to labs. Alcohol can cause pancreatitis. ? ?For intermittent back pain placed xrays orders.  ? ?Asthma stable presently. Refilled inhaler. ? ?Follow up in approxomate one month or sooner. Recommend on follow up to come in fasting to do wellness exam. ?

## 2021-12-11 NOTE — Progress Notes (Signed)
? ?  Subjective:  ? ? Patient ID: Juan Howard, male    DOB: 12-07-1975, 46 y.o.   MRN: [id number] ? ?HPI ?Pt has 3 complaints. Leg cramps, back pain and abd pain. On weekends drinking alcohol about pint over weekend. Sweats a lot at work. Worse in spring and summer. ? ?Leg cramps are occurring at night. Occurring over past 2 weeks. Some cramping in feet as well. No leg cramping on walking around at work. ? ?Abd pain- occurred after eating 3 hot dogs with some sauerkraut. Felt bloated for 3-4 days and had gas-x. Pain resolved and has not returned. No nausea, no vomiting and no diarrhea. No black or bloody stools.  ? ? ?Review of Systems ?See hpi. ?   ?Objective:  ? Physical Exam ? ?General- No acute distress. Pleasant patient. ?Neck- Full range of motion, no jvd ?Lungs- Clear, even and unlabored. ?Heart- regular rate and rhythm. ?Neurologic- CNII- XII grossly intact.  ?Abdomen- soft, nt, nd, +bs. No rebound or guarding. ? ?Lower ext- pulse intact. No pain over calfs. No swelling. Negative homans signs. ? ?   ?Assessment & Plan:  ? ?Patient Instructions  ?For leg cramps will get cmp and mg level. Stretch calfs and avoid dehydration. Cut back on alcohol use. Over spring and summer if sweating a lot whole working use sugar free gatorade or use [employer] water. ? ?Low iron in past recheck iron. ? ?Fatigue- will check cbc, cmp and thyorid studies. ? ?Recent abd pain resolved. Likely related to diet/food you at that day. Eat healthy diet and if on occasional pain after eating try omeprazole. Will add lipase to labs. Alcohol can cause pancreatitis. ? ?For intermittent back pain placed xrays orders.  ? ?Asthma stable presently. Refilled inhaler. ? ?Follow up in approxomate one month or sooner. Recommend on follow up to come in fasting to do wellness exam.  ? ?Esperanza Richters, PA-C  ?

## 2022-01-26 DIAGNOSIS — Z20822 Contact with and (suspected) exposure to covid-19: Secondary | ICD-10-CM | POA: Diagnosis not present

## 2022-04-22 ENCOUNTER — Other Ambulatory Visit: Payer: Self-pay | Admitting: Medical

## 2022-12-12 ENCOUNTER — Other Ambulatory Visit: Payer: Self-pay | Admitting: Medical

## 2022-12-12 MED ORDER — ALBUTEROL SULFATE HFA 108 (90 BASE) MCG/ACT IN AERS: 2.0000 | INHALATION_SPRAY | Freq: Four times a day (QID) | RESPIRATORY_TRACT | 0 refills | Status: AC | PRN

## 2022-12-12 NOTE — Telephone Encounter (Signed)
Patient called me and requested refill of his albuterol.  We had conversation on the phone.  I discussed his recent shortness of breath.  He described he was at a track  today with his daughter and then gradually started to feel short of breath.  Described felt it more with activity not at rest.  On review no pain behind the knees or calf swelling.  No chest pain reported.  Patient states  typically if he gets short of breath he will respond quickly to albuterol inhaler(I have filled this in the past for him).  Advised patient that I will go ahead and send albuterol inhaler to his pharmacy but did explain in detail there is differential diagnosis to consider. If he does take albuterol and does not respond significantly then advised patient to be seen in the urgent care or emergency department.  Patient expressed understanding.  Also encouraged him to get O2 sat monitor at the pharmacy.  Educated patient on what normal levels are, moderate good readings and explained that readings below 90% are very worrisome and in that event would advise be seen in the emergency department.  Did discuss with patient not in office next week and if he is doing relatively well but not ideally then he could also try to schedule appointment with another provider.

## 2022-12-12 NOTE — Addendum Note (Signed)
Addended by: Anabel Halon on: 12/12/2022 05:57 PM   Modules accepted: Orders

## 2023-10-15 ENCOUNTER — Other Ambulatory Visit: Payer: Self-pay | Admitting: Medical

## 2024-03-20 ENCOUNTER — Other Ambulatory Visit: Payer: Self-pay | Admitting: Medical

## 2024-03-20 MED ORDER — ALBUTEROL SULFATE HFA 108 (90 BASE) MCG/ACT IN AERS: INHALATION_SPRAY | RESPIRATORY_TRACT | 2 refills | Status: AC

## 2024-03-20 NOTE — Telephone Encounter (Signed)
 Copied from CRM [id number]. Topic: Clinical - Medication Refill >> Mar 20, 2024 10:46 AM Chuck Crater wrote: Medication: albuterol  (VENTOLIN  HFA) 108 (90 Base) MCG/ACT inhaler   Has the patient contacted their pharmacy? Yes (Agent: If no, request that the patient contact the pharmacy for the refill. If patient does not wish to contact the pharmacy document the reason why and proceed with request.) (Agent: If yes, when and what did the pharmacy advise?) stated medication didn't show p in pharmacy records  This is the patient's preferred pharmacy:  [id number] [address], [place] - [address] [place] [address] Phone: [phone] Fax: [phone] Is this the correct pharmacy for this prescription? Yes If no, delete pharmacy and type the correct one.   Has the prescription been filled recently? No  Is the patient out of the medication? Yes patient is going out of the country on friday  Has the patient been seen for an appointment in the last year OR does the patient have an upcoming appointment? Yes  Can we respond through MyChart? No  Agent: Please be advised that Rx refills may take up to 3 business days. We ask that you follow-up with your pharmacy.

## 2024-07-07 ENCOUNTER — Ambulatory Visit: Admitting: Medical

## 2024-07-07 ENCOUNTER — Ambulatory Visit (HOSPITAL_BASED_OUTPATIENT_CLINIC_OR_DEPARTMENT_OTHER)
Admission: RE | Admit: 2024-07-07 | Discharge: 2024-07-07 | Disposition: A | Discharge status: Home / Self Care | Source: Ambulatory Visit | Attending: Medical | Admitting: Medical

## 2024-07-07 VITALS — BP 122/96 | HR 94 | Temp 98.1°F | Resp 15 | Ht 71.0 in | Wt 145.4 lb

## 2024-07-07 DIAGNOSIS — J40 Bronchitis, not specified as acute or chronic: Secondary | ICD-10-CM | POA: Diagnosis not present

## 2024-07-07 DIAGNOSIS — M25552 Pain in left hip: Secondary | ICD-10-CM | POA: Insufficient documentation

## 2024-07-07 DIAGNOSIS — R1013 Epigastric pain: Secondary | ICD-10-CM

## 2024-07-07 DIAGNOSIS — G8929 Other chronic pain: Secondary | ICD-10-CM

## 2024-07-07 DIAGNOSIS — F17200 Nicotine dependence, unspecified, uncomplicated: Secondary | ICD-10-CM | POA: Insufficient documentation

## 2024-07-07 DIAGNOSIS — M545 Low back pain, unspecified: Secondary | ICD-10-CM | POA: Diagnosis present

## 2024-07-07 DIAGNOSIS — Z1211 Encounter for screening for malignant neoplasm of colon: Secondary | ICD-10-CM

## 2024-07-07 DIAGNOSIS — Z125 Encounter for screening for malignant neoplasm of prostate: Secondary | ICD-10-CM

## 2024-07-07 DIAGNOSIS — J189 Pneumonia, unspecified organism: Secondary | ICD-10-CM

## 2024-07-07 DIAGNOSIS — F32A Depression, unspecified: Secondary | ICD-10-CM

## 2024-07-07 DIAGNOSIS — R062 Wheezing: Secondary | ICD-10-CM

## 2024-07-07 DIAGNOSIS — F419 Anxiety disorder, unspecified: Secondary | ICD-10-CM

## 2024-07-07 DIAGNOSIS — R634 Abnormal weight loss: Secondary | ICD-10-CM

## 2024-07-07 LAB — COMPREHENSIVE METABOLIC PANEL WITH GFR
AG Ratio: 0.9 (calc) — ABNORMAL LOW (ref 1.0–2.5)
ALT: 14 U/L (ref 9–46)
AST: 16 U/L (ref 10–40)
Albumin: 3.6 g/dL (ref 3.6–5.1)
Alkaline phosphatase (APISO): 82 U/L (ref 36–130)
BUN: 9 mg/dL (ref 7–25)
CO2: 31 mmol/L (ref 20–32)
Calcium: 9.3 mg/dL (ref 8.6–10.3)
Chloride: 88 mmol/L — ABNORMAL LOW (ref 98–110)
Creat: 0.7 mg/dL (ref 0.60–1.29)
Globulin: 4 g/dL — ABNORMAL HIGH (ref 1.9–3.7)
Glucose, Bld: 90 mg/dL (ref 65–99)
Potassium: 3.3 mmol/L — ABNORMAL LOW (ref 3.5–5.3)
Sodium: 133 mmol/L — ABNORMAL LOW (ref 135–146)
Total Bilirubin: 0.8 mg/dL (ref 0.2–1.2)
Total Protein: 7.6 g/dL (ref 6.1–8.1)
eGFR: 115 mL/min/1.73m2 (ref >=60)

## 2024-07-07 LAB — CBC WITH DIFFERENTIAL/PLATELET
Absolute Lymphocytes: 2086 {cells}/uL (ref 850–3900)
Absolute Monocytes: 1386 {cells}/uL — ABNORMAL HIGH (ref 200–950)
Basophils Absolute: 98 {cells}/uL (ref 0–200)
Basophils Relative: 0.6 %
Eosinophils Absolute: 49 {cells}/uL (ref 15–500)
Eosinophils Relative: 0.3 %
HCT: 41.1 % (ref 38.5–50.0)
Hemoglobin: 14.4 g/dL (ref 13.2–17.1)
MCH: 35.3 pg — ABNORMAL HIGH (ref 27.0–33.0)
MCHC: 35 g/dL (ref 32.0–36.0)
MCV: 100.7 fL — ABNORMAL HIGH (ref 80.0–100.0)
MPV: 10.4 fL (ref 7.5–12.5)
Monocytes Relative: 8.5 %
Neutro Abs: 12681 {cells}/uL — ABNORMAL HIGH (ref 1500–7800)
Neutrophils Relative %: 77.8 %
Platelets: 494 Thousand/uL — ABNORMAL HIGH (ref 140–400)
RBC: 4.08 Million/uL — ABNORMAL LOW (ref 4.20–5.80)
RDW: 12.4 % (ref 11.0–15.0)
Total Lymphocyte: 12.8 %
WBC: 16.3 Thousand/uL — ABNORMAL HIGH (ref 3.8–10.8)

## 2024-07-07 LAB — LIPASE: Lipase: 25 U/L (ref 7–60)

## 2024-07-07 MED ORDER — VENLAFAXINE HCL ER 37.5 MG PO CP24: 37.5000 mg | ORAL_CAPSULE | Freq: Every day | ORAL | 0 refills | Status: DC

## 2024-07-07 MED ORDER — FAMOTIDINE 20 MG PO TABS: 20.0000 mg | ORAL_TABLET | Freq: Every day | ORAL | 0 refills | Status: DC

## 2024-07-07 MED ORDER — AZITHROMYCIN 250 MG PO TABS
ORAL_TABLET | ORAL | 0 refills | Status: AC
Start: 2024-07-07 — End: 2024-07-12

## 2024-07-07 MED ORDER — METHYLPREDNISOLONE 4 MG PO TABS: ORAL_TABLET | ORAL | 0 refills | Status: AC

## 2024-07-07 MED ORDER — BENZONATATE 100 MG PO CAPS: 100.0000 mg | ORAL_CAPSULE | Freq: Three times a day (TID) | ORAL | 0 refills | Status: DC | PRN

## 2024-07-07 MED ORDER — BUDESONIDE-FORMOTEROL FUMARATE 160-4.5 MCG/ACT IN AERO: 2.0000 | INHALATION_SPRAY | Freq: Two times a day (BID) | RESPIRATORY_TRACT | 3 refills | Status: AC

## 2024-07-07 NOTE — Patient Instructions (Addendum)
 1. Screening for colon cancer - Ambulatory referral to Gastroenterology  2. Pain of left hip  - DG HIP UNILAT WITH PELVIS 2-3 VIEWS LEFT; Future  3. Smoker  - DG Chest 2 View; Future  4. Epigastric pain -can use famotadine for occasional reflux. - Lipase - Comp Met (CMET) - CBC with Differential/Platelet  5. Weight loss -need to work up with below and may need other studies as well. - Lipase - Comp Met (CMET) - CBC with Differential/Platelet - Ambulatory referral to Gastroenterology  6. Screening for prostate cancer psa   Chronic midline low back pain without sciatica - DG Lumbar Spine 2-3 Views; Future  9. Bronchitis (Primary) -medrol  taper pack. Zpack and rx symbicort .   10. Anxiety and depression. -sertraline  caused fatigue. He stopped after brief use. -effexor  37.5 mg daily dose.   Follow up in 10 days or sooner

## 2024-07-07 NOTE — Progress Notes (Addendum)
 Subjective:    Patient ID: Juan Howard, male    DOB: 1976-07-02, 48 y.o.   MRN: [id number]  HPI  [facility] visit 07-01-2024   URI  Cough, congestion, and sore throat x6 days.  Patient presents with his wife. For the past 6 days she has had congestion, sore throat, cough productive of some clear sputum. This morning the sputum was slightly blood-tinged. He states he did have a fever up to 102 on Thursday. He has been taking Tylenol  for the fever as well as body aches. He has also been taking TheraFlu. He is a smoker.  FINAL IMPRESSIONS   1. Upper respiratory tract infection, unspecified type  2. Mild intermittent asthma with exacerbation (CMD)  Review of Systems:  Pertinent positives and negatives are incorporated into the HPI.  Since June wife states using albuterol  5-7 times a day.   See below ros. Still having respiratory symptoms.    Smoked since 48 yo. Cigars a day. Black and mild. Marijuana small amount in black and mild. 3.5 grams of marijuana last 2 weeks.   Pt has lost more than 30 lbs over past 2 years. Hx of some bright red blood on and off over a year. Not daily blood. Pt describes some bulge at time. Does not itch or hurt. Pt states works thru lunch.Pt appetite has declined. Eating smaller meals and only 1-2 meals. No abdomen pain.       Review of Systems  Constitutional:  Positive for unexpected weight change. Negative for chills and fatigue.       Weight loss over past year.  HENT:  Negative for congestion and ear pain.   Eyes:  Negative for visual disturbance.  Respiratory:  Positive for cough and wheezing.        Pt had been coughing up some blood  Some blood in mucus.    Pt recently had soludmeedrol from UC after coughing and wheezing. Some chest congestion. He states felt a lot better within 24 hours then he got wheezy again. No tabs. 2 covid test before went to UC negative   Pt states he has been using albuterol  every 6 hours since mid summer but  more frequently over past 2 weeks.  Cardiovascular:  Negative for chest pain and palpitations.  Gastrointestinal:  Negative for abdominal pain, nausea and vomiting.  Musculoskeletal:  Positive for back pain.       Left hip pain on and off for one year. 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 No fall.   Also lumbar back pain  Skin:  Negative for rash.  Neurological:  Negative for seizures, facial asymmetry and light-headedness.  Hematological:  Negative for adenopathy.  Psychiatric/Behavioral:  Positive for dysphoric mood. Negative for behavioral problems and suicidal ideas. The patient is nervous/anxious.    . Past Medical History:  Diagnosis Date   Asthma    in past. Rare use about 6 times a year uses inhaler.   GERD (gastroesophageal reflux disease)    Headache 07/15/2017     Social History   Socioeconomic History   Marital status: Married    Spouse name: Steen    Number of children: 1   Years of education: 12   Highest education level: Not on file  Occupational History   Occupation: Science writer   Tobacco Use   Smoking status: Every Day    Types: Cigars   Smokeless tobacco: Never   Tobacco comments:    2-3 cigars per day  Vaping  Use   Vaping status: Never Used  Substance and Sexual Activity   Alcohol use: Yes    Comment: 12 pack a week.   Drug use: Yes    Types: Marijuana   Sexual activity: Yes  Other Topics Concern   Not on file  Social History Narrative   Lives with wife   Caffeine use: 1 cup coffee every morning   4-6 cups pepsi daily   Right handed    Social Drivers of Health   Financial Resource Strain: Not on file  Food Insecurity: Not on file  Transportation Needs: Not on file  Physical Activity: Not on file  Stress: Not on file  Social Connections: Not on file  Intimate Partner Violence: Not on file    Past Surgical History:  Procedure Laterality Date   APPENDECTOMY      No family history on file.  No Known  Allergies  Current Outpatient Medications on File Prior to Visit  Medication Sig Dispense Refill   albuterol  (VENTOLIN  HFA) 108 (90 Base) MCG/ACT inhaler Inhale 2 puffs into the lungs every 6 (six) hours as needed. 18 g 0   omeprazole  (PRILOSEC) 20 MG capsule TAKE 1 CAPSULE BY MOUTH EVERY DAY 90 capsule 1   albuterol  (VENTOLIN  HFA) 108 (90 Base) MCG/ACT inhaler TAKE 2 PUFFS BY MOUTH EVERY 6 HOURS AS NEEDED FOR WHEEZE OR SHORTNESS OF BREATH (Patient not taking: Reported on 07/07/2024) 18 each 2   busPIRone  (BUSPAR ) 7.5 MG tablet TAKE 1 TABLET BY MOUTH 2 TIMES DAILY. (Patient not taking: Reported on 07/07/2024) 180 tablet 1   clonazePAM  (KLONOPIN ) 0.5 MG tablet Take 1 tablet (0.5 mg total) by mouth at bedtime. (Patient not taking: Reported on 07/07/2024) 30 tablet 1   diphenhydrAMINE  (BENADRYL ) 25 MG tablet Take 2 tablets (50 mg total) by mouth every 8 (eight) hours as needed for itching or allergies. (Patient not taking: Reported on 07/07/2024) 30 tablet 0   EPINEPHrine  0.3 mg/0.3 mL IJ SOAJ injection Inject 0.3 mLs (0.3 mg total) into the muscle as needed for anaphylaxis. (Patient not taking: Reported on 07/07/2024) 1 Device 1   famotidine  (PEPCID ) 20 MG tablet Take 1 tablet (20 mg total) by mouth daily. (Patient not taking: Reported on 07/07/2024) 5 tablet 0   ferrous sulfate  324 (65 Fe) MG TBEC Take 1 tablet (325 mg total) by mouth 2 (two) times daily. (Patient not taking: Reported on 07/07/2024) 180 tablet 1   levocetirizine (XYZAL ) 5 MG tablet Take 1 tablet (5 mg total) by mouth every evening. (Patient not taking: Reported on 07/07/2024) 30 tablet 3   montelukast  (SINGULAIR ) 10 MG tablet Take 1 tablet (10 mg total) by mouth at bedtime. (Patient not taking: Reported on 07/07/2024) 30 tablet 3   topiramate  (TOPAMAX ) 25 MG tablet Take one tablet at night for one week, then take 2 tablets at night for one week, then take 3 tablets at night. (Patient not taking: Reported on 07/07/2024) 90 tablet 3   No current  facility-administered medications on file prior to visit.    BP (!) 122/96   Pulse 94   Temp 98.1 F (36.7 C) (Oral)   Resp 15   Ht 5' 11 (1.803 m)   Wt 145 lb 6.4 oz (66 kg)   SpO2 94%   BMI 20.28 kg/m         Objective:   Physical Exam  General Mental Status- Alert. General Appearance- Not in acute distress.   Skin General: Color- Normal Color. Moisture- Normal  Moisture.  Neck Carotid Arteries- Normal color. Moisture- Normal Moisture. No carotid bruits. No JVD.  Chest and Lung Exam Auscultation: Breath Sounds:-even unlabored but mild shallow respirations.  Cardiovascular Auscultation:Rythm- Regular. Murmurs & Other Heart Sounds:Auscultation of the heart reveals- No Murmurs.  Abdomen Inspection:-Inspeection Normal. Palpation/Percussion:Note:No mass. Palpation and Percussion of the abdomen reveal- Non Tender, Non Distended + BS, no rebound or guarding.   Neurologic Cranial Nerve exam:- CN III-XII intact(No nystagmus), symmetric smile. tStrength:- 5/5 equal and symmetric strength both upper and lower extremities.   Lower ext- calfs symmetric, negative homans signs. No pedal edema bilaterall.    Assessment & Plan:   Patient Instructions  1. Screening for colon cancer - Ambulatory referral to Gastroenterology  2. Pain of left hip  - DG HIP UNILAT WITH PELVIS 2-3 VIEWS LEFT; Future  3. Smoker  - DG Chest 2 View; Future  4. Epigastric pain -can use famotadine for occasional reflux. - Lipase - Comp Met (CMET) - CBC with Differential/Platelet  5. Weight loss -need to work up with below and may need other studies as well. - Lipase - Comp Met (CMET) - CBC with Differential/Platelet - Ambulatory referral to Gastroenterology  6. Screening for prostate cancer psa   Chronic midline low back pain without sciatica - DG Lumbar Spine 2-3 Views; Future  9. Bronchitis (Primary) -medrol  taper pack. Zpack and rx symbicort .   10. Anxiety and  depression. -sertraline  caused fatigue. He stopped after brief use. -effexor  37.5 mg daily dose.   Follow up in 10 days or sooner      [employer], PA-C    I personally spent a total of 45 minutes in the care of the patient today including getting/reviewing separately obtained history, performing a medically appropriate exam/evaluation, counseling and educating, placing orders, referring and communicating with other health care professionals, and documenting clinical information in the EHR.

## 2024-07-08 ENCOUNTER — Ambulatory Visit: Payer: Self-pay | Admitting: Medical

## 2024-07-08 MED ORDER — AMOXICILLIN-POT CLAVULANATE 875-125 MG PO TABS: 1.0000 | ORAL_TABLET | Freq: Two times a day (BID) | ORAL | 0 refills | Status: DC

## 2024-07-08 NOTE — Addendum Note (Signed)
 Addended by: DORINA DALLAS HERO on: 07/08/2024 11:28 AM   Modules accepted: Orders

## 2024-07-08 NOTE — Addendum Note (Signed)
 Addended by: DORINA DALLAS HERO on: 07/08/2024 12:55 PM   Modules accepted: Orders

## 2024-07-17 ENCOUNTER — Ambulatory Visit: Admitting: Medical

## 2024-07-17 ENCOUNTER — Other Ambulatory Visit: Payer: Self-pay | Admitting: Medical

## 2024-07-17 VITALS — BP 138/90 | HR 77 | Temp 98.3°F | Resp 15 | Ht 71.0 in | Wt 147.2 lb

## 2024-07-17 DIAGNOSIS — J189 Pneumonia, unspecified organism: Secondary | ICD-10-CM | POA: Diagnosis not present

## 2024-07-17 DIAGNOSIS — Z125 Encounter for screening for malignant neoplasm of prostate: Secondary | ICD-10-CM

## 2024-07-17 DIAGNOSIS — T782XXA Anaphylactic shock, unspecified, initial encounter: Secondary | ICD-10-CM

## 2024-07-17 DIAGNOSIS — D72829 Elevated white blood cell count, unspecified: Secondary | ICD-10-CM

## 2024-07-17 DIAGNOSIS — F17200 Nicotine dependence, unspecified, uncomplicated: Secondary | ICD-10-CM | POA: Diagnosis not present

## 2024-07-17 DIAGNOSIS — E875 Hyperkalemia: Secondary | ICD-10-CM

## 2024-07-17 DIAGNOSIS — R062 Wheezing: Secondary | ICD-10-CM

## 2024-07-17 DIAGNOSIS — R634 Abnormal weight loss: Secondary | ICD-10-CM

## 2024-07-17 DIAGNOSIS — R739 Hyperglycemia, unspecified: Secondary | ICD-10-CM

## 2024-07-17 MED ORDER — AUVI-Q 0.3 MG/0.3ML IJ SOAJ: 0.3000 mg | INTRAMUSCULAR | 1 refills | Status: AC | PRN

## 2024-07-17 MED ORDER — EPINEPHRINE 0.3 MG/0.3ML IJ SOAJ: 0.3000 mg | INTRAMUSCULAR | 1 refills | Status: DC | PRN

## 2024-07-17 MED ORDER — VENLAFAXINE HCL ER 37.5 MG PO CP24: 37.5000 mg | ORAL_CAPSULE | Freq: Every day | ORAL | 11 refills | Status: AC

## 2024-07-17 NOTE — Patient Instructions (Addendum)
 Pneumonia, left lower lobe, resolving Pneumonia in the superior segment of the left lower lobe, initially diagnosed via chest x-ray. Symptoms improved with treatment. - Ensure completion of Augmentin course and azithromycin . - Repeat chest x-ray in four weeks. - Advised to go for x-ray between 8 AM and 4 PM.  Asthma vs (possible copd) Asthma with recent flare-up, likely exacerbated by smoking history. Symptoms improved with Medrol  and Symbicort . Reduced use of albuterol  noted. - Continue Symbicort , two inhalations twice a day. - Referral to pulmonologist for pulmonary function test. - Encourage reduction in smoking.  Unintentional weight loss Gradual weight loss over two years, from 163 lbs to 147 lbs. Potential causes include decreased caloric intake and possible underlying conditions. Previous labs showed low potassium and elevated infection-fighting cells, likely related to pneumonia. Discussed importance of increasing caloric intake and monitoring weight. - Increase caloric intake to three meals a day. - Schedule colonoscopy. - Order PSA for prostate cancer screening. - Repeat CBC and metabolic panel in one week to check potassium and infection-fighting cells. - Consider CT of chest and abdomen if weight does not increase. - Monitor weight and report in two weeks.  Hypokalemia, resolving Previous hypokalemia. Discussed alternative potassium-rich foods. - Repeat metabolic panel in one week to check potassium levels. - Encourage consumption of potassium-rich foods.  Tobacco use History of smoking, currently reduced to one cigar per day. Smoking cessation encouraged to reduce risk of respiratory issues and potential cancer. - Continue efforts to reduce smoking. - Discuss potential for CT lung cancer screening if smoking continues.  Anxiety and depressed mood Anxiety and depressed mood managed with Effexor  37.5 mg. Symptoms well-managed and medication well-tolerated. - Refill Effexor   37.5 mg. - Monitor symptoms and consider dose adjustment if needed.  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 - Refill EpiPen  [facility]  Follow up in 2 months or sooner if needed based on lab and imaging reviews.  08/03/2024 Talked to peer to peer provider/MD. Explained reasoning for ordering ct chest, abd and pelvis. I ordered without contrast. Reviewer told me should be with contrast and did approve order with contrast for indication of unintended weight loss.

## 2024-07-17 NOTE — Progress Notes (Signed)
 Subjective:    Patient ID: Juan Howard, male    DOB: [birth year], 48 y.o.   MRN: [id number]  HPI Juan Howard is a 48 year old male with asthma who presents for follow-up of pneumonia and asthma management.  He has experienced improved energy levels and breathing following treatment for pneumonia and an asthma flare. Initially, he presented to urgent care with symptoms of cough, congestion, sore throat, and fever, leading to a diagnosis of an upper respiratory infection and asthma exacerbation.   On follow up with me  a chest x-ray performed showed an abnormality in the superior segment of the left lower lobe, and the patient I treated pt for  pneumonia. He was prescribed Augmentin and azithromycin , completing the azithromycin  course and continuing with Augmentin. I also received a Medrol  dose pack for wheezing and shortness of breath, which he has completed.  Currently, he no longer experiences shortness of breath or wheezing and can walk over half a mile without difficulty, although he notes knee pain after walking half a mile. He has a history of asthma since childhood and has had pneumonia approximately four times as a child. He is using Symbicort  inhaler, two inhalations twice a day, and has reduced his use of albuterol . Breathing much better now.  He has a history of smoking and has reduced his smoking from three black and mild cigars a day to one, with an attempt to quit entirely. He has experienced weight loss, dropping from 163 pounds in March 2023 to 147 pounds currently, although he has gained a few pounds recently. He attributes some weight loss to difficulty taking breaks at work to eat, often eating only once or twice a day. Pt is a Curator.  He has a history of anxiety and is currently taking Effexor  37.5 mg, which he finds effective in maintaining an even mood without excessive sedation. He is due for a refill around October 26th.  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 He has used EpiPens in the past and has recently refilled his prescription.  Previous blood work indicated low potassium levels. He dislikes bananas but has been drinking electrolyte drinks like sugar-free Gatorade or Propel to help with potassium intake.      Review of Systems  Constitutional:  Negative for chills, fatigue and fever.  Respiratory:  Negative for chest tightness, shortness of breath and wheezing.   Cardiovascular:  Negative for chest pain and palpitations.  Gastrointestinal:  Negative for abdominal pain, constipation, nausea and rectal pain.  Genitourinary:  Negative for dysuria, frequency, penile pain, penile swelling and urgency.  Musculoskeletal:  Negative for back pain, joint swelling and neck pain.  Neurological:  Negative for dizziness, weakness and headaches.  Hematological:  Negative for adenopathy. Does not bruise/bleed easily.  Psychiatric/Behavioral:  Negative for behavioral problems, dysphoric mood and hallucinations.      Past Medical History:  Diagnosis Date   Asthma    in past. Rare use about 6 times a year uses inhaler.   GERD (gastroesophageal reflux disease)    Headache 07/15/2017     Social History   Socioeconomic History   Marital status: Married    Spouse name: Steen    Number of children: 1   Years of education: 12   Highest education level: Not on file  Occupational History   Occupation: Science writer   Tobacco Use   Smoking status: Every Day  Types: Cigars   Smokeless tobacco: Never   Tobacco comments:    2-3 cigars per day  Vaping Use   Vaping status: Never Used  Substance and Sexual Activity   Alcohol use: Yes    Comment: 12 pack a week.   Drug use: Yes    Types: Marijuana   Sexual activity: Yes  Other Topics Concern   Not on file  Social History Narrative   Lives with wife   Caffeine use: 1 cup coffee every morning   4-6 cups pepsi daily   Right  handed    Social Drivers of Health   Financial Resource Strain: Not on file  Food Insecurity: Not on file  Transportation Needs: Not on file  Physical Activity: Not on file  Stress: Not on file  Social Connections: Not on file  Intimate Partner Violence: Not on file    Past Surgical History:  Procedure Laterality Date   APPENDECTOMY      No family history on file.  No Known Allergies  Current Outpatient Medications on File Prior to Visit  Medication Sig Dispense Refill   albuterol  (VENTOLIN  HFA) 108 (90 Base) MCG/ACT inhaler Inhale 2 puffs into the lungs every 6 (six) hours as needed. 18 g 0   budesonide -formoterol  (SYMBICORT ) 160-4.5 MCG/ACT inhaler Inhale 2 puffs into the lungs 2 (two) times daily. 1 each 3   famotidine  (PEPCID ) 20 MG tablet Take 1 tablet (20 mg total) by mouth daily. 30 tablet 0   ferrous sulfate  324 (65 Fe) MG TBEC Take 1 tablet (325 mg total) by mouth 2 (two) times daily. 180 tablet 1   methylPREDNISolone  (MEDROL ) 4 MG tablet Standard 6 day taper dose 21 tablet 0   omeprazole  (PRILOSEC) 20 MG capsule TAKE 1 CAPSULE BY MOUTH EVERY DAY 90 capsule 1   venlafaxine  XR (EFFEXOR  XR) 37.5 MG 24 hr capsule Take 1 capsule (37.5 mg total) by mouth daily with breakfast. 30 capsule 0   albuterol  (VENTOLIN  HFA) 108 (90 Base) MCG/ACT inhaler TAKE 2 PUFFS BY MOUTH EVERY 6 HOURS AS NEEDED FOR WHEEZE OR SHORTNESS OF BREATH (Patient not taking: Reported on 07/17/2024) 18 each 2   busPIRone  (BUSPAR ) 7.5 MG tablet TAKE 1 TABLET BY MOUTH 2 TIMES DAILY. (Patient not taking: Reported on 07/17/2024) 180 tablet 1   clonazePAM  (KLONOPIN ) 0.5 MG tablet Take 1 tablet (0.5 mg total) by mouth at bedtime. (Patient not taking: Reported on 07/17/2024) 30 tablet 1   diphenhydrAMINE  (BENADRYL ) 25 MG tablet Take 2 tablets (50 mg total) by mouth every 8 (eight) hours as needed for itching or allergies. (Patient not taking: Reported on 07/17/2024) 30 tablet 0   famotidine  (PEPCID ) 20 MG tablet Take 1  tablet (20 mg total) by mouth daily. (Patient not taking: Reported on 07/17/2024) 5 tablet 0   levocetirizine (XYZAL ) 5 MG tablet Take 1 tablet (5 mg total) by mouth every evening. (Patient not taking: Reported on 07/17/2024) 30 tablet 3   montelukast  (SINGULAIR ) 10 MG tablet Take 1 tablet (10 mg total) by mouth at bedtime. (Patient not taking: Reported on 07/17/2024) 30 tablet 3   topiramate  (TOPAMAX ) 25 MG tablet Take one tablet at night for one week, then take 2 tablets at night for one week, then take 3 tablets at night. (Patient not taking: Reported on 07/17/2024) 90 tablet 3   No current facility-administered medications on file prior to visit.    BP (!) 138/90   Pulse 77   Temp 98.3 F (36.8 C) (Oral)  Resp 15   Ht 5' 11 (1.803 m)   Wt 147 lb 3.2 oz (66.8 kg)   SpO2 98%   BMI 20.53 kg/m           Objective:   Physical Exam  General Mental Status- Alert. General Appearance- Not in acute distress.   Skin General: Color- Normal Color. Moisture- Normal Moisture.  Neck Carotid Arteries- Normal color. Moisture- Normal Moisture. No carotid bruits. No JVD.  Chest and Lung Exam Auscultation: Breath Sounds:-Even and unlabored  Cardiovascular Auscultation:Rythm- RRR Murmurs & Other Heart Sounds:Auscultation of the heart reveals- No Murmurs.  Abdomen Inspection:-Inspeection Normal. Palpation/Percussion:Note:No mass. Palpation and Percussion of the abdomen reveal- Non Tender, Non Distended + BS, no rebound or guarding.    Neurologic Cranial Nerve exam:- CN III-XII intact(No nystagmus), symmetric smile. Strength:- 5/5 equal and symmetric strength both upper and lower extremities.        Assessment & Plan:   Patient Instructions  Pneumonia, left lower lobe, resolving Pneumonia in the superior segment of the left lower lobe, initially diagnosed via chest x-ray. Symptoms improved with treatment. - Ensure completion of Augmentin course and azithromycin . - Repeat  chest x-ray in four weeks. - Advised to go for x-ray between 8 AM and 4 PM.  Asthma vs (possible copd) Asthma with recent flare-up, likely exacerbated by smoking history. Symptoms improved with Medrol  and Symbicort . Reduced use of albuterol  noted. - Continue Symbicort , two inhalations twice a day. - Referral to pulmonologist for pulmonary function test. - Encourage reduction in smoking.  Unintentional weight loss Gradual weight loss over two years, from 163 lbs to 147 lbs. Potential causes include decreased caloric intake and possible underlying conditions. Previous labs showed low potassium and elevated infection-fighting cells, likely related to pneumonia. Discussed importance of increasing caloric intake and monitoring weight. - Increase caloric intake to three meals a day. - Schedule colonoscopy. - Order PSA for prostate cancer screening. - Repeat CBC and metabolic panel in one week to check potassium and infection-fighting cells. - Consider CT of chest and abdomen if weight does not increase. - Monitor weight and report in two weeks.  Hypokalemia, resolving Previous hypokalemia. Discussed alternative potassium-rich foods. - Repeat metabolic panel in one week to check potassium levels. - Encourage consumption of potassium-rich foods.  Tobacco use History of smoking, currently reduced to one cigar per day. Smoking cessation encouraged to reduce risk of respiratory issues and potential cancer. - Continue efforts to reduce smoking. - Discuss potential for CT lung cancer screening if smoking continues.  Anxiety and depressed mood Anxiety and depressed mood managed with Effexor  37.5 mg. Symptoms well-managed and medication well-tolerated. - Refill Effexor  37.5 mg. - Monitor symptoms and consider dose adjustment if needed.  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 - Refill EpiPen  and send prescription to  [facility].  Follow up in 2 months or sooner if needed based on lab and imaging reviews.

## 2024-07-18 ENCOUNTER — Telehealth: Payer: Self-pay

## 2024-07-18 NOTE — Telephone Encounter (Signed)
 Called pt and notified him that lab approved for him to come in at 5:30 on 10/14

## 2024-07-25 ENCOUNTER — Other Ambulatory Visit

## 2024-07-25 DIAGNOSIS — Z125 Encounter for screening for malignant neoplasm of prostate: Secondary | ICD-10-CM

## 2024-07-25 DIAGNOSIS — E875 Hyperkalemia: Secondary | ICD-10-CM | POA: Diagnosis not present

## 2024-07-25 DIAGNOSIS — R739 Hyperglycemia, unspecified: Secondary | ICD-10-CM | POA: Diagnosis not present

## 2024-07-25 DIAGNOSIS — D72829 Elevated white blood cell count, unspecified: Secondary | ICD-10-CM | POA: Diagnosis not present

## 2024-07-26 ENCOUNTER — Ambulatory Visit: Payer: Self-pay | Admitting: Medical

## 2024-07-26 LAB — COMPREHENSIVE METABOLIC PANEL WITH GFR
ALT: 14 U/L (ref 0–53)
AST: 15 U/L (ref 0–37)
Albumin: 4.1 g/dL (ref 3.5–5.2)
Alkaline Phosphatase: 77 U/L (ref 39–117)
BUN: 8 mg/dL (ref 6–23)
CO2: 25 meq/L (ref 19–32)
Calcium: 9.4 mg/dL (ref 8.4–10.5)
Chloride: 103 meq/L (ref 96–112)
Creatinine, Ser: 0.68 mg/dL (ref 0.40–1.50)
GFR: 111.07 mL/min (ref >60.00)
Glucose, Bld: 106 mg/dL — ABNORMAL HIGH (ref 70–99)
Potassium: 3.8 meq/L (ref 3.5–5.1)
Sodium: 141 meq/L (ref 135–145)
Total Bilirubin: 0.7 mg/dL (ref 0.2–1.2)
Total Protein: 7.1 g/dL (ref 6.0–8.3)

## 2024-07-26 LAB — CBC WITH DIFFERENTIAL/PLATELET
Basophils Absolute: 0.1 K/uL (ref 0.0–0.1)
Basophils Relative: 0.8 % (ref 0.0–3.0)
Eosinophils Absolute: 0 K/uL (ref 0.0–0.7)
Eosinophils Relative: 0.4 % (ref 0.0–5.0)
HCT: 44.5 % (ref 39.0–52.0)
Hemoglobin: 14.7 g/dL (ref 13.0–17.0)
Lymphocytes Relative: 19.5 % (ref 12.0–46.0)
Lymphs Abs: 1.9 K/uL (ref 0.7–4.0)
MCHC: 33.1 g/dL (ref 30.0–36.0)
MCV: 104.2 fl — ABNORMAL HIGH (ref 78.0–100.0)
Monocytes Absolute: 0.8 K/uL (ref 0.1–1.0)
Monocytes Relative: 8 % (ref 3.0–12.0)
Neutro Abs: 6.9 K/uL (ref 1.4–7.7)
Neutrophils Relative %: 71.3 % (ref 43.0–77.0)
Platelets: 264 K/uL (ref 150.0–400.0)
RBC: 4.27 Mil/uL (ref 4.22–5.81)
RDW: 15 % (ref 11.5–15.5)
WBC: 9.7 K/uL (ref 4.0–10.5)

## 2024-07-26 LAB — HEMOGLOBIN A1C: Hgb A1c MFr Bld: 6.2 % (ref 4.6–6.5)

## 2024-07-26 LAB — PSA: PSA: 1.25 ng/mL (ref 0.10–4.00)

## 2024-07-26 NOTE — Addendum Note (Signed)
 Addended by: DORINA DALLAS HERO on: 07/26/2024 12:04 PM   Modules accepted: Orders

## 2024-07-29 ENCOUNTER — Other Ambulatory Visit: Payer: Self-pay | Admitting: Medical

## 2024-08-03 NOTE — Addendum Note (Signed)
 Addended by: DORINA DALLAS HERO on: 08/03/2024 03:27 PM   Modules accepted: Orders

## 2024-11-11 ENCOUNTER — Encounter: Payer: Self-pay | Admitting: Medical

## 2024-11-11 ENCOUNTER — Telehealth: Payer: Self-pay | Admitting: Medical

## 2024-11-11 NOTE — Telephone Encounter (Signed)
 [place] or [place] please call pt and review this my chart message with him. He has been sent my chart message but appears to not be reviewing. So please call him. This was my last message below. Let me know what he says.    Back in October or so I saw you and subsequent to that visit did order ct studies to work up your weight loss. I had to do peer to peer phone call to get prior authorization. We notified you of the authorization. Later we had brief discussion as I was walking around our neighborhood. You had updated me that you would get those imaging studies. Recently had remembered that I did not see those reports. On review of your chart I can see that staff has been sending my chart messages asking you to schedule. Please follow thru with that. When authorization is given I think it is for particular time period. In your case with weight loose the sooner we get results the better. There is already significant delay. I will get staff member to try to call you directly in the event you don't read this my chart message.

## 2024-11-14 NOTE — Telephone Encounter (Signed)
 Pcp letter to pt has been mailed
